# Patient Record
Sex: Female | Born: 1970 | Race: White | Hispanic: No | Smoking: Current every day smoker
Health system: Southern US, Community
[De-identification: ages and names within clinical notes are randomized; demographics above are authoritative.]

## PROBLEM LIST (undated history)

## (undated) DIAGNOSIS — K900 Celiac disease: Secondary | ICD-10-CM

## (undated) DIAGNOSIS — M1990 Unspecified osteoarthritis, unspecified site: Secondary | ICD-10-CM

## (undated) DIAGNOSIS — M5030 Other cervical disc degeneration, unspecified cervical region: Secondary | ICD-10-CM

## (undated) DIAGNOSIS — T148XXA Other injury of unspecified body region, initial encounter: Secondary | ICD-10-CM

## (undated) DIAGNOSIS — M549 Dorsalgia, unspecified: Secondary | ICD-10-CM

## (undated) DIAGNOSIS — R000 Tachycardia, unspecified: Secondary | ICD-10-CM

## (undated) DIAGNOSIS — M5430 Sciatica, unspecified side: Secondary | ICD-10-CM

---

## 2006-12-07 ENCOUNTER — Emergency Department (HOSPITAL_COMMUNITY): Admission: EM | Admit: 2006-12-07 | Discharge: 2006-12-07 | Payer: Self-pay | Admitting: Emergency Medicine

## 2006-12-29 ENCOUNTER — Emergency Department (HOSPITAL_COMMUNITY): Admission: EM | Admit: 2006-12-29 | Discharge: 2006-12-29 | Payer: Self-pay | Admitting: Emergency Medicine

## 2007-01-11 ENCOUNTER — Emergency Department (HOSPITAL_COMMUNITY): Admission: EM | Admit: 2007-01-11 | Discharge: 2007-01-11 | Payer: Self-pay | Admitting: Emergency Medicine

## 2007-03-07 ENCOUNTER — Emergency Department (HOSPITAL_COMMUNITY): Admission: EM | Admit: 2007-03-07 | Discharge: 2007-03-07 | Payer: Self-pay | Admitting: *Deleted

## 2007-08-01 ENCOUNTER — Emergency Department (HOSPITAL_COMMUNITY): Admission: EM | Admit: 2007-08-01 | Discharge: 2007-08-01 | Payer: Self-pay | Admitting: Emergency Medicine

## 2007-09-18 ENCOUNTER — Emergency Department (HOSPITAL_COMMUNITY): Admission: EM | Admit: 2007-09-18 | Discharge: 2007-09-18 | Payer: Self-pay | Admitting: Emergency Medicine

## 2007-11-24 ENCOUNTER — Emergency Department (HOSPITAL_COMMUNITY): Admission: EM | Admit: 2007-11-24 | Discharge: 2007-11-24 | Payer: Self-pay | Admitting: Emergency Medicine

## 2008-03-04 ENCOUNTER — Emergency Department (HOSPITAL_COMMUNITY): Admission: EM | Admit: 2008-03-04 | Discharge: 2008-03-04 | Payer: Self-pay | Admitting: Emergency Medicine

## 2008-06-19 ENCOUNTER — Emergency Department (HOSPITAL_COMMUNITY): Admission: EM | Admit: 2008-06-19 | Discharge: 2008-06-19 | Payer: Self-pay | Admitting: Emergency Medicine

## 2008-12-03 ENCOUNTER — Emergency Department (HOSPITAL_COMMUNITY): Admission: EM | Admit: 2008-12-03 | Discharge: 2008-12-03 | Payer: Self-pay | Admitting: Emergency Medicine

## 2010-05-20 ENCOUNTER — Emergency Department (HOSPITAL_COMMUNITY): Payer: Medicaid Other

## 2010-05-20 ENCOUNTER — Emergency Department (HOSPITAL_COMMUNITY)
Admission: EM | Admit: 2010-05-20 | Discharge: 2010-05-20 | Disposition: A | Discharge status: Home / Self Care | Payer: Medicaid Other | Attending: Emergency Medicine | Admitting: Emergency Medicine

## 2010-05-20 DIAGNOSIS — M25519 Pain in unspecified shoulder: Secondary | ICD-10-CM | POA: Insufficient documentation

## 2010-07-13 ENCOUNTER — Emergency Department (HOSPITAL_COMMUNITY)
Admission: EM | Admit: 2010-07-13 | Discharge: 2010-07-13 | Disposition: A | Discharge status: Home / Self Care | Payer: Self-pay | Attending: Emergency Medicine | Admitting: Emergency Medicine

## 2010-07-13 DIAGNOSIS — M545 Low back pain, unspecified: Secondary | ICD-10-CM | POA: Insufficient documentation

## 2010-07-13 DIAGNOSIS — F17200 Nicotine dependence, unspecified, uncomplicated: Secondary | ICD-10-CM | POA: Insufficient documentation

## 2010-08-22 NOTE — Consult Note (Signed)
NAME:  [name], [name] NO.:  [id number]   MEDICAL RECORD NO.:  [id number]          PATIENT TYPE:  EMS   LOCATION:  ED                            FACILITY:  [facility]   PHYSICIAN:  Barbaraann Barthel, M.D. DATE OF BIRTH:  1970-04-26   DATE OF CONSULTATION:  DATE OF DISCHARGE:  06/19/2008                                 CONSULTATION   Dr Dalia Heading:   I saw Mr. Lisa Mcclain in my office on September 11, 2008 at which time I evaluated  a large abdominal wall hernia.  As you know, this is the result of  multiple surgeries which began in 2007 after being treated for ruptured  diverticulitis with a Hartmann procedure.  In essence, he has had a  failed repair of the resulting abdominal wall hernia treated with mesh  in the past and now presents with essentially a complete breakdown of  the same.   I reviewed his medical records and after his examination in considering  the risks and benefit on this patient, I would certainly NOT consider  operating on him.  He seems to be getting along fairly well with an  abdominal binder, and I think that is the safest approach.  I discussed  this candidly with him and his son and I referred him back to Dr.  Cleotis Nipper, whom he identifies as  his primary surgeon and I wanted to  let you know (since you did his last repair) what my interaction was  with this patient.   Sincerely,  Wm.S.Bradford MD      Barbaraann Barthel, M.D.  Electronically Signed     WB/MEDQ  D:  09/11/2008  T:  09/12/2008  Job:  [id number]   cc:   [name], M.D.  Fax: [phone]

## 2010-08-30 ENCOUNTER — Emergency Department (HOSPITAL_COMMUNITY)
Admission: EM | Admit: 2010-08-30 | Discharge: 2010-08-30 | Disposition: A | Discharge status: Home / Self Care | Payer: Medicaid Other | Attending: Emergency Medicine | Admitting: Emergency Medicine

## 2010-08-30 DIAGNOSIS — M25519 Pain in unspecified shoulder: Secondary | ICD-10-CM | POA: Insufficient documentation

## 2010-08-30 DIAGNOSIS — M545 Low back pain, unspecified: Secondary | ICD-10-CM | POA: Insufficient documentation

## 2010-08-30 DIAGNOSIS — M5430 Sciatica, unspecified side: Secondary | ICD-10-CM | POA: Insufficient documentation

## 2010-08-30 DIAGNOSIS — K900 Celiac disease: Secondary | ICD-10-CM | POA: Insufficient documentation

## 2010-10-11 ENCOUNTER — Emergency Department (HOSPITAL_COMMUNITY)
Admission: EM | Admit: 2010-10-11 | Discharge: 2010-10-12 | Disposition: A | Discharge status: Home / Self Care | Payer: Medicaid Other | Attending: Emergency Medicine | Admitting: Emergency Medicine

## 2010-10-11 ENCOUNTER — Encounter: Payer: Self-pay | Admitting: *Deleted

## 2010-10-11 DIAGNOSIS — M5430 Sciatica, unspecified side: Secondary | ICD-10-CM

## 2010-10-11 DIAGNOSIS — F17200 Nicotine dependence, unspecified, uncomplicated: Secondary | ICD-10-CM | POA: Insufficient documentation

## 2010-10-11 HISTORY — DX: Tachycardia, unspecified: R00.0

## 2010-10-11 HISTORY — DX: Celiac disease: K90.0

## 2010-10-11 HISTORY — DX: Dorsalgia, unspecified: M54.9

## 2010-10-11 MED ORDER — DEXAMETHASONE SODIUM PHOSPHATE 4 MG/ML IJ SOLN
8.0000 mg | Freq: Once | INTRAMUSCULAR | Status: AC
  Administered 2010-10-11: 8 mg via INTRAMUSCULAR
  Filled 2010-10-11: qty 2

## 2010-10-11 MED ORDER — CYCLOBENZAPRINE HCL 10 MG PO TABS
10.0000 mg | ORAL_TABLET | Freq: Once | ORAL | Status: AC
  Administered 2010-10-11: 10 mg via ORAL
  Filled 2010-10-11: qty 1

## 2010-10-11 MED ORDER — HYDROCODONE-ACETAMINOPHEN 5-325 MG PO TABS
2.0000 | ORAL_TABLET | Freq: Once | ORAL | Status: AC
  Administered 2010-10-11: 2 via ORAL
  Filled 2010-10-11: qty 2

## 2010-10-11 NOTE — ED Notes (Signed)
Pt was unloading 50lb bags of horse feed and felt her lower back catch.

## 2010-10-11 NOTE — ED Notes (Signed)
Family at bedside. 

## 2010-10-11 NOTE — ED Notes (Signed)
Patient is resting comfortably. 

## 2010-10-11 NOTE — ED Notes (Signed)
Family at bedside. Pt asking how much longer before she is discharged and states she is ready to go: I informed pt that the EDP would discharge her as soon as he could; pt is getting dressed at this time

## 2010-10-12 ENCOUNTER — Encounter (HOSPITAL_COMMUNITY): Payer: Self-pay

## 2010-10-12 MED ORDER — CYCLOBENZAPRINE HCL 10 MG PO TABS: 10.0000 mg | ORAL_TABLET | Freq: Two times a day (BID) | ORAL | Status: AC | PRN

## 2010-10-12 MED ORDER — HYDROCODONE-ACETAMINOPHEN 5-500 MG PO TABS: 1.0000 | ORAL_TABLET | Freq: Four times a day (QID) | ORAL | Status: AC | PRN

## 2010-10-12 NOTE — ED Provider Notes (Addendum)
History     Chief Complaint  Patient presents with  . Back Pain    Pt c/o lower back pain radiating down right leg.   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  She has had radiation of numbness to her ankle prior to today.  Patient is a 40 y.o. female presenting with back pain. The history is provided by the patient.  Back Pain  This is a chronic problem. The current episode started 6 to 12 hours ago. The problem occurs constantly. The problem has not changed since onset.The pain is associated with lifting heavy objects. Pain location: Para lumbar pain. The quality of the pain is described as burning and shooting. The pain radiates to the right thigh. The pain is at a severity of 8/10. The symptoms are aggravated by bending, twisting and certain positions. Associated symptoms include numbness. Pertinent negatives include no bowel incontinence, no perianal numbness, no bladder incontinence, no dysuria, no pelvic pain, no paresthesias and no weakness. She has tried bed rest for the symptoms. The treatment provided no relief.    Past Medical History  Diagnosis Date  . Celiac disease   . Back pain   . Tachycardia     History reviewed. No pertinent past surgical history.  Family History  Problem Relation Age of Onset  . Cancer Other     History  Substance Use Topics  . Smoking status: Current Everyday Smoker  . Smokeless tobacco: Not on file  . Alcohol Use: No    OB History    Grav Para Term Preterm Abortions TAB SAB Ect Mult Living   3 2   1            Review of Systems  Gastrointestinal: Negative for bowel incontinence.  Genitourinary: Negative for bladder incontinence, dysuria, hematuria, difficulty urinating and pelvic pain.  Musculoskeletal: Positive for back pain.  Neurological: Positive for numbness. Negative for weakness and paresthesias.  All other systems reviewed and are negative.    Physical Exam  BP 131/83   Temp(Src) 98.5 F (36.9 C) (Oral)  Resp 20  Ht 5\' 5"  (1.651 m)  Wt 150 lb (68.04 kg)  BMI 24.96 kg/m2  SpO2 100%  LMP 10/11/2010  Physical Exam  Constitutional: She is oriented to person, place, and time. She appears well-developed and well-nourished. She appears distressed.  HENT:  Head: Normocephalic and atraumatic.  Eyes: Pupils are equal, round, and reactive to light.  Neck: Normal range of motion.  Pulmonary/Chest: Effort normal and breath sounds normal.  Abdominal: Soft.  Musculoskeletal: Normal range of motion.       Lumbar back: She exhibits tenderness and pain. She exhibits normal range of motion, no swelling and no deformity.  Neurological: She is alert and oriented to person, place, and time.  Skin: Skin is warm and dry.  Psychiatric: She has a normal mood and affect.    ED Course  Procedures  MDM   Low back pain c rad to bilat legs R>L;  Chronic;  No bowel/bladder incontinence    Candis Musa, PA 10/12/10 0038  Donnetta Hutching, MD 10/12/10 [phone]

## 2010-10-30 ENCOUNTER — Emergency Department (HOSPITAL_COMMUNITY)
Admission: EM | Admit: 2010-10-30 | Discharge: 2010-10-30 | Disposition: A | Discharge status: Home / Self Care | Payer: Medicaid Other | Attending: Emergency Medicine | Admitting: Emergency Medicine

## 2010-10-30 ENCOUNTER — Encounter (HOSPITAL_COMMUNITY): Payer: Self-pay | Admitting: *Deleted

## 2010-10-30 DIAGNOSIS — F17200 Nicotine dependence, unspecified, uncomplicated: Secondary | ICD-10-CM | POA: Insufficient documentation

## 2010-10-30 DIAGNOSIS — M5430 Sciatica, unspecified side: Secondary | ICD-10-CM | POA: Insufficient documentation

## 2010-10-30 MED ORDER — CYCLOBENZAPRINE HCL 10 MG PO TABS: 10.0000 mg | ORAL_TABLET | Freq: Three times a day (TID) | ORAL | Status: AC | PRN

## 2010-10-30 MED ORDER — OXYCODONE-ACETAMINOPHEN 5-325 MG PO TABS
1.0000 | ORAL_TABLET | Freq: Once | ORAL | Status: AC
  Administered 2010-10-30: 1 via ORAL
  Filled 2010-10-30: qty 1

## 2010-10-30 MED ORDER — CYCLOBENZAPRINE HCL 10 MG PO TABS
10.0000 mg | ORAL_TABLET | Freq: Once | ORAL | Status: AC
  Administered 2010-10-30: 10 mg via ORAL
  Filled 2010-10-30: qty 1

## 2010-10-30 MED ORDER — OXYCODONE-ACETAMINOPHEN 5-325 MG PO TABS: 1.0000 | ORAL_TABLET | ORAL | Status: AC | PRN

## 2010-10-30 NOTE — ED Provider Notes (Signed)
History     Chief Complaint  Patient presents with  . Back Pain    lower back pain radiating down LLE   HPI Comments: Patient c/o increased low back pain since yesterday.  States she bent over to pick up something and felt a sharp pain in her left  Lower back and radiating into her leg.  She denies numbness, weakness, incontinence of feces or urine.  Also denies fall  Patient is a 40 y.o. female presenting with back pain. The history is provided by the patient.  Back Pain  This is a recurrent problem. The current episode started more than 2 days ago. The problem occurs constantly. The problem has been gradually worsening. Associated with: bent over to pick up something. The pain is present in the lumbar spine. The quality of the pain is described as shooting, burning and stabbing. The pain radiates to the left thigh. The pain is at a severity of 10/10. The pain is moderate. The symptoms are aggravated by bending and certain positions. The pain is the same all the time. Pertinent negatives include no chest pain, no fever, no numbness, no headaches, no abdominal pain, no abdominal swelling, no bowel incontinence, no bladder incontinence, no dysuria, no pelvic pain, no paresthesias, no paresis, no tingling and no weakness. She has tried nothing for the symptoms.    Past Medical History  Diagnosis Date  . Celiac disease   . Back pain   . Tachycardia     History reviewed. No pertinent past surgical history.  Family History  Problem Relation Age of Onset  . Cancer Other     History  Substance Use Topics  . Smoking status: Current Everyday Smoker  . Smokeless tobacco: Not on file  . Alcohol Use: No    OB History    Grav Para Term Preterm Abortions TAB SAB Ect Mult Living   3 2   1            Review of Systems  Constitutional: Negative for fever, chills, appetite change and fatigue.  HENT: Negative for neck pain and neck stiffness.   Respiratory: Negative for cough, chest  tightness and wheezing.   Cardiovascular: Negative for chest pain.  Gastrointestinal: Negative for abdominal pain and bowel incontinence.  Genitourinary: Negative for bladder incontinence, dysuria, frequency, flank pain, vaginal bleeding, vaginal discharge and pelvic pain.  Musculoskeletal: Positive for back pain. Negative for myalgias and gait problem.  Skin: Negative.   Neurological: Negative for dizziness, tingling, weakness, numbness, headaches and paresthesias.  Hematological: Does not bruise/bleed easily.    Physical Exam  BP 117/54  Pulse 74  Temp(Src) 98.6 F (37 C) (Oral)  Resp 20  Ht 5\' 5"  (1.651 m)  Wt 160 lb (72.576 kg)  BMI 26.63 kg/m2  SpO2 99%  LMP 10/11/2010  Physical Exam  Nursing note and vitals reviewed. Constitutional: She appears well-developed and well-nourished. No distress.  HENT:  Head: Normocephalic and atraumatic.  Eyes: EOM are normal.  Neck: Normal range of motion.  Cardiovascular: Normal rate, regular rhythm and normal heart sounds.   Pulmonary/Chest: Effort normal and breath sounds normal. No respiratory distress. She exhibits no tenderness.  Abdominal: Soft. There is no tenderness. There is no rebound and no guarding.  Musculoskeletal: She exhibits tenderness. She exhibits no edema.       Lumbar back: She exhibits tenderness.       Back:  Lymphadenopathy:    She has no cervical adenopathy.  Neurological: She is alert. She has  normal reflexes. She exhibits normal muscle tone. Coordination normal.  Skin: Skin is warm.  Psychiatric: She has a normal mood and affect.    ED Course  Procedures  MDM   Patient ambulated to the restroom w/o difficulty.  Vitals stable.  ttp to left lumbar paraspinal muscles and left SI joint.  Likely sciatica     Karyn Brull L. Keisean Skowron, [place] 10/30/10 1351

## 2010-10-30 NOTE — ED Notes (Signed)
C/o onset lower back pain, radiating down LLE, yesterday after doing yardwork; hx chronic back pain

## 2010-11-22 ENCOUNTER — Encounter (HOSPITAL_COMMUNITY): Payer: Self-pay | Admitting: *Deleted

## 2010-11-22 ENCOUNTER — Emergency Department (HOSPITAL_COMMUNITY)
Admission: EM | Admit: 2010-11-22 | Discharge: 2010-11-22 | Disposition: A | Discharge status: Home / Self Care | Payer: Medicaid Other | Attending: Emergency Medicine | Admitting: Emergency Medicine

## 2010-11-22 DIAGNOSIS — M5412 Radiculopathy, cervical region: Secondary | ICD-10-CM | POA: Insufficient documentation

## 2010-11-22 DIAGNOSIS — F17200 Nicotine dependence, unspecified, uncomplicated: Secondary | ICD-10-CM | POA: Insufficient documentation

## 2010-11-22 DIAGNOSIS — M542 Cervicalgia: Secondary | ICD-10-CM

## 2010-11-22 MED ORDER — HYDROCODONE-ACETAMINOPHEN 5-325 MG PO TABS
1.0000 | ORAL_TABLET | Freq: Once | ORAL | Status: AC
  Administered 2010-11-22: 1 via ORAL
  Filled 2010-11-22: qty 1

## 2010-11-22 MED ORDER — CYCLOBENZAPRINE HCL 10 MG PO TABS: 5.0000 mg | ORAL_TABLET | Freq: Three times a day (TID) | ORAL | Status: DC | PRN

## 2010-11-22 MED ORDER — LORAZEPAM 1 MG PO TABS: 1.0000 mg | ORAL_TABLET | Freq: Three times a day (TID) | ORAL | Status: AC | PRN

## 2010-11-22 MED ORDER — HYDROCODONE-ACETAMINOPHEN 5-325 MG PO TABS: 2.0000 | ORAL_TABLET | ORAL | Status: DC | PRN

## 2010-11-22 MED ORDER — CYCLOBENZAPRINE HCL 10 MG PO TABS
5.0000 mg | ORAL_TABLET | Freq: Once | ORAL | Status: AC
  Administered 2010-11-22: 5 mg via ORAL
  Filled 2010-11-22 (×2): qty 1

## 2010-11-22 NOTE — ED Notes (Signed)
Patient with no complaints at this time. Respirations even and unlabored. Skin warm/dry. Discharge instructions reviewed with patient at this time. Patient given opportunity to voice concerns/ask questions. Patient discharged at this time and left Emergency Department with steady gait.   

## 2010-11-22 NOTE — ED Notes (Signed)
Pt c/o pain in her neck radiating down her left arm x 1 week. Pt states that it hurts to move her neck. Denies injury.

## 2010-11-22 NOTE — ED Provider Notes (Addendum)
History     CSN: [id number] Arrival date & time: 11/22/2010 12:25 PM  Chief Complaint  Patient presents with  . Neck Pain   Patient is a 40 y.o. female presenting with neck pain.  Neck Pain  Associated symptoms include numbness.   Complains of left-sided neck pain radiating to left arm, scapular area and fingers the left hand onset one week ago worse with moving her neck with some numbness and left scapular area no weakness treated herself with her husbands gabapentin and Flexeril with partial relief symptoms improved with remaining still. She is also using a sling periodically . No fever no injury Past Medical History  Diagnosis Date  . Celiac disease   . Back pain   . Tachycardia     History reviewed. No pertinent past surgical history.  Family History  Problem Relation Age of Onset  . Cancer Other     History  Substance Use Topics  . Smoking status: Current Everyday Smoker  . Smokeless tobacco: Not on file  . Alcohol Use: No    OB History    Grav Para Term Preterm Abortions TAB SAB Ect Mult Living   3 2   1            Review of Systems  Constitutional: Negative.   HENT: Positive for neck pain.   Respiratory: Negative.   Cardiovascular: Negative.   Gastrointestinal: Negative.   Skin: Negative.   Neurological: Positive for numbness.  Hematological: Negative.   Psychiatric/Behavioral: Negative.     Physical Exam  BP 145/92  Pulse 112  Temp(Src) 98.4 F (36.9 C) (Oral)  Resp 18  Ht 5\' 5"  (1.651 m)  Wt 160 lb (72.576 kg)  BMI 26.63 kg/m2  SpO2 100%  LMP 11/09/2010  Physical Exam  Nursing note and vitals reviewed. Constitutional: She appears well-developed and well-nourished.  HENT:  Head: Normocephalic and atraumatic.  Eyes: Conjunctivae are normal. Pupils are equal, round, and reactive to light.  Neck: Neck supple. No tracheal deviation present. No thyromegaly present.       Or point tenderness no spasm  Cardiovascular: Normal rate and regular  rhythm.   No murmur heard. Pulmonary/Chest: Effort normal and breath sounds normal.  Abdominal: Soft. Bowel sounds are normal. She exhibits no distension. There is no tenderness.  Musculoskeletal: Normal range of motion. She exhibits no edema and no tenderness.  Neurological: She is alert. She has normal reflexes. Coordination normal.       Is 5 over 5 overall  Skin: Skin is warm and dry. No rash noted.  Psychiatric: She has a normal mood and affect.    ED Course  Procedures  MDM Plan a cervical spine was ordered for 11/24/2010 as outpatient Prescription Flexeril, hydrocodone -apap    Prescription for Ativan one tablet no refills 1 hour prior MRI this patient exhibits claustrophobia  Doug Sou, MD 11/22/10 1414  Doug Sou, MD 11/22/10 1418

## 2010-11-24 ENCOUNTER — Encounter (HOSPITAL_COMMUNITY): Payer: Self-pay | Admitting: *Deleted

## 2010-11-24 ENCOUNTER — Emergency Department (HOSPITAL_COMMUNITY)
Admission: EM | Admit: 2010-11-24 | Discharge: 2010-11-24 | Disposition: A | Discharge status: Home / Self Care | Payer: Medicaid Other | Attending: Emergency Medicine | Admitting: Emergency Medicine

## 2010-11-24 DIAGNOSIS — F17200 Nicotine dependence, unspecified, uncomplicated: Secondary | ICD-10-CM | POA: Insufficient documentation

## 2010-11-24 DIAGNOSIS — M5412 Radiculopathy, cervical region: Secondary | ICD-10-CM | POA: Insufficient documentation

## 2010-11-24 DIAGNOSIS — M542 Cervicalgia: Secondary | ICD-10-CM

## 2010-11-24 MED ORDER — DIAZEPAM 5 MG PO TABS: 5.0000 mg | ORAL_TABLET | Freq: Three times a day (TID) | ORAL | Status: DC | PRN

## 2010-11-24 MED ORDER — OXYCODONE-ACETAMINOPHEN 5-325 MG PO TABS
1.0000 | ORAL_TABLET | Freq: Once | ORAL | Status: AC
  Administered 2010-11-24: 1 via ORAL
  Filled 2010-11-24: qty 1

## 2010-11-24 MED ORDER — OXYCODONE-ACETAMINOPHEN 5-325 MG PO TABS: 1.0000 | ORAL_TABLET | ORAL | Status: DC | PRN

## 2010-11-24 MED ORDER — DIAZEPAM 5 MG PO TABS
5.0000 mg | ORAL_TABLET | Freq: Once | ORAL | Status: AC
  Administered 2010-11-24: 5 mg via ORAL
  Filled 2010-11-24: qty 1

## 2010-11-24 MED ORDER — DEXAMETHASONE SODIUM PHOSPHATE 10 MG/ML IJ SOLN
10.0000 mg | Freq: Once | INTRAMUSCULAR | Status: AC
  Administered 2010-11-24: 10 mg via INTRAMUSCULAR
  Filled 2010-11-24: qty 1

## 2010-11-24 NOTE — ED Notes (Signed)
Seen x 2 days ago for same - Has appt for MRI on Wednesday of this week.  States pain is worse today upon awakening.  States pushed buggy of groceries yesterday.  States neck pain is radiating into left arm.

## 2010-11-24 NOTE — ED Notes (Signed)
Pt c/o neck pain radiating into shoulder. Seen last week and told to return if symptoms persisted. Pt reports she is out of pain medicine and Flexoril "is not working." MRI scheduled for Wed.

## 2010-11-25 NOTE — ED Provider Notes (Addendum)
History     CSN: [id number] Arrival date & time: 11/24/2010  2:30 PM  Chief Complaint  Patient presents with  . Neck Pain   Patient is a 40 y.o. female presenting with neck pain. The history is provided by the patient.  Neck Pain  This is a new (She describes pain in her left neck radiating down her left arm into her fingers.) problem. The current episode started more than 1 week ago. The problem occurs constantly. The problem has been gradually worsening. The pain is associated with nothing (She was seen for the same symptoms here 2 days ago and is scheduled for a c spine mri in 2 days.  Pushed a cart at the grocery store yesterday now with increased pain.). The pain is present in the left side. The quality of the pain is described as stabbing and shooting. The pain radiates to the left scapula, left shoulder, left arm, left forearm and left hand. The pain is at a severity of 8/10. The pain is moderate. The symptoms are aggravated by position. The pain is the same all the time. Associated symptoms include numbness. Pertinent negatives include no chest pain, no paresis and no weakness. Treatments tried: narcotics and flexeril prescribed 2 days ago. The treatment provided mild relief.    Past Medical History  Diagnosis Date  . Celiac disease   . Back pain   . Tachycardia     History reviewed. No pertinent past surgical history.  Family History  Problem Relation Age of Onset  . Cancer Other     History  Substance Use Topics  . Smoking status: Current Everyday Smoker  . Smokeless tobacco: Not on file  . Alcohol Use: No    OB History    Grav Para Term Preterm Abortions TAB SAB Ect Mult Living   3 2   1            Review of Systems  Constitutional: Negative.   HENT: Positive for neck pain. Negative for neck stiffness.   Eyes: Negative.   Cardiovascular: Negative.  Negative for chest pain.  Gastrointestinal: Negative.   Genitourinary: Negative.   Musculoskeletal: Negative for  myalgias and joint swelling.  Skin: Negative.   Neurological: Positive for numbness. Negative for weakness.  Psychiatric/Behavioral: The patient is nervous/anxious.     Physical Exam  BP 160/113  Pulse 117  Temp(Src) 98.4 F (36.9 C) (Oral)  Resp 20  Ht 5\' 5"  (1.651 m)  Wt 160 lb (72.576 kg)  BMI 26.63 kg/m2  SpO2 100%  LMP 11/09/2010  Physical Exam  Nursing note and vitals reviewed. Constitutional: She is oriented to person, place, and time. She appears well-developed and well-nourished.       Patient appears very anxious,  Distressed with frequent furrowing of brows and wincing - this appears to resolve when she is describing her history of present illness.  HENT:  Head: Normocephalic and atraumatic.  Eyes: Conjunctivae are normal.  Neck: Trachea normal. Neck supple. Muscular tenderness present. No spinous process tenderness present. No rigidity. No edema and no erythema present. No Brudzinski's sign noted. No thyromegaly present.       Decreased active ROM with left neck rotation.  Cardiovascular: Normal rate, regular rhythm, normal heart sounds and intact distal pulses.   Pulmonary/Chest: Effort normal and breath sounds normal. She has no wheezes.  Abdominal: Soft. Bowel sounds are normal. There is no tenderness.  Musculoskeletal: Normal range of motion. She exhibits no edema and no tenderness.  Left hand: She exhibits normal range of motion, no tenderness, normal capillary refill and no swelling. normal sensation noted. Normal strength noted.  Neurological: She is alert and oriented to person, place, and time. She has normal strength. A sensory deficit is present.  Reflex Scores:      Bicep reflexes are 2+ on the right side and 2+ on the left side.      Brachioradialis reflexes are 2+ on the right side and 2+ on the left side.      Decreased sensation to fine touch left forearm and hands compared to right.  DTR's intact  Skin: Skin is warm and dry.  Psychiatric: She  has a normal mood and affect.    ED Course  Procedures  MDM Patient pending MRI - no emergent findings on exam today.  Increase pain medicine with oxycodone,  Switch from flexeril to valium given anxious presentation and claim that flexeril is not relieving sx.      Candis Musa, PA 11/25/10 1053   11/26/2010; 16:50. Discussed MRI results with patient in person.  Referred to Dr. Lovell Sheehan for orthopedic surgery consultation. She is nearly out of her pain medication so I ordered refill.  DISCHARGE MEDICATIONS: New Prescriptions   DIAZEPAM (VALIUM) 5 MG TABLET    Take 1 tablet (5 mg total) by mouth every 8 (eight) hours as needed (muscle spasm).   OXYCODONE-ACETAMINOPHEN (PERCOCET) 5-325 MG PER TABLET    Take 1 tablet by mouth every 4 (four) hours as needed for pain.   Scribed for and reviewed by: Kennon Rounds, MD Scribed by: Earl Gala, MD 11/26/10 [phone]

## 2010-11-26 ENCOUNTER — Ambulatory Visit (HOSPITAL_COMMUNITY)
Admit: 2010-11-26 | Discharge: 2010-11-26 | Disposition: A | Discharge status: Home / Self Care | Payer: Medicaid Other | Source: Ambulatory Visit | Attending: Emergency Medicine | Admitting: Emergency Medicine

## 2010-11-26 DIAGNOSIS — R209 Unspecified disturbances of skin sensation: Secondary | ICD-10-CM | POA: Insufficient documentation

## 2010-11-26 DIAGNOSIS — M542 Cervicalgia: Secondary | ICD-10-CM | POA: Insufficient documentation

## 2010-11-26 DIAGNOSIS — M5030 Other cervical disc degeneration, unspecified cervical region: Secondary | ICD-10-CM | POA: Insufficient documentation

## 2010-11-26 MED ORDER — OXYCODONE-ACETAMINOPHEN 5-325 MG PO TABS: 1.0000 | ORAL_TABLET | ORAL | Status: AC | PRN

## 2010-11-26 MED ORDER — DIAZEPAM 5 MG PO TABS: 5.0000 mg | ORAL_TABLET | Freq: Three times a day (TID) | ORAL | Status: AC | PRN

## 2010-11-26 NOTE — ED Provider Notes (Signed)
Medical screening examination/treatment/procedure(s) were performed by non-physician practitioner and as supervising physician I was immediately available for consultation/collaboration.   Forbes Cellar, MD 11/26/10 [phone]

## 2010-11-30 ENCOUNTER — Encounter (HOSPITAL_COMMUNITY): Payer: Self-pay

## 2010-11-30 ENCOUNTER — Emergency Department (HOSPITAL_COMMUNITY)
Admission: EM | Admit: 2010-11-30 | Discharge: 2010-11-30 | Disposition: A | Discharge status: Home / Self Care | Payer: Medicaid Other | Attending: Emergency Medicine | Admitting: Emergency Medicine

## 2010-11-30 DIAGNOSIS — Z760 Encounter for issue of repeat prescription: Secondary | ICD-10-CM | POA: Insufficient documentation

## 2010-11-30 DIAGNOSIS — M542 Cervicalgia: Secondary | ICD-10-CM

## 2010-11-30 DIAGNOSIS — F17200 Nicotine dependence, unspecified, uncomplicated: Secondary | ICD-10-CM | POA: Insufficient documentation

## 2010-11-30 MED ORDER — OXYCODONE-ACETAMINOPHEN 5-325 MG PO TABS
1.0000 | ORAL_TABLET | Freq: Once | ORAL | Status: AC
  Administered 2010-11-30: 1 via ORAL
  Filled 2010-11-30: qty 1

## 2010-11-30 MED ORDER — OXYCODONE-ACETAMINOPHEN 5-325 MG PO TABS: 1.0000 | ORAL_TABLET | ORAL | Status: DC | PRN

## 2010-11-30 MED ORDER — OXYCODONE-ACETAMINOPHEN 5-325 MG PO TABS: 1.0000 | ORAL_TABLET | Freq: Once | ORAL | Status: AC

## 2010-11-30 NOTE — ED Notes (Signed)
Pt reports being dx with a "pinched nerve" in her neck on Wednesday.  Pt reports having an appt with a neurosurgeon tmrw at 3:00pm.  Pt was given percocet and valium for pain.  Pt is now out of pain meds and doesn't think that she can make it until tmrw.  nad noted

## 2010-11-30 NOTE — ED Provider Notes (Signed)
Medical screening examination/treatment/procedure(s) were performed by non-physician practitioner and as supervising physician I was immediately available for consultation/collaboration.  Devoria Albe, MD, FACEP  Ward Givens, MD 11/30/10 [phone]

## 2010-11-30 NOTE — ED Provider Notes (Signed)
History     CSN: [id number] Arrival date & time: 11/30/2010 11:53 AM  Chief Complaint  Patient presents with  . Neck Pain   Patient is a 40 y.o. female presenting with neck pain. The history is provided by the patient.  Neck Pain  The current episode started more than 1 week ago. The problem occurs constantly. There has been no fever. The pain is present in the left side. The quality of the pain is described as stabbing. The pain radiates to the left arm. The symptoms are aggravated by position.   Lisa Mcclain is a 40 y.o. WF who presents to the ED for medication refill. She was evaluated on 8/22 and had MRI of her neck and results showed a pinched nerve. She is scheduled to see the Neurosurgeon tomorrow in [place]. She is currently taking valium and percocet. She ran out of the percocet and request enough to get her through tomorrow when she sees the doctor.      Past Medical History  Diagnosis Date  . Celiac disease   . Back pain   . Tachycardia     History reviewed. No pertinent past surgical history.  Family History  Problem Relation Age of Onset  . Cancer Other     History  Substance Use Topics  . Smoking status: Current Everyday Smoker  . Smokeless tobacco: Not on file  . Alcohol Use: No    OB History    Grav Para Term Preterm Abortions TAB SAB Ect Mult Living   3 2   1            Review of Systems  HENT: Positive for neck pain.   All other systems reviewed and are negative.    Physical Exam  BP 115/84  Pulse 128  Temp(Src) 98.6 F (37 C) (Oral)  Resp 22  Ht 5\' 4"  (1.626 m)  Wt 160 lb (72.576 kg)  BMI 27.46 kg/m2  SpO2 100%  LMP 11/09/2010  Physical Exam  Nursing note and vitals reviewed. Constitutional: She is oriented to person, place, and time. She appears well-developed and well-nourished.  HENT:  Head: Normocephalic and atraumatic.  Eyes: EOM are normal. Pupils are equal, round, and reactive to light.  Neck: Neck supple.       Pain  with ROM of neck with radiating pain to left arm.  Pulmonary/Chest: Effort normal. No respiratory distress.  Musculoskeletal: Normal range of motion. She exhibits no edema.  Neurological: She is alert and oriented to person, place, and time. No cranial nerve deficit.       Equal grips bilaterally.  Skin: Skin is warm and dry.  Psychiatric: She has a normal mood and affect.    ED Course  Procedures  MDM Assessment: Neck pain                        Medication refill  Plan: refill percocet           Continue valium          Follow up with neuro as planned.          Return here as needed.           [place], [place] 11/30/10 1252

## 2010-11-30 NOTE — ED Notes (Signed)
Pt a/ox4. Resp even and unlabored. NAD at this time. D/C instructions reviewed with pt. Pt verbalized understanding. Pt ambulated with steady gate to POV. Friend with pt to drive home.

## 2010-12-01 NOTE — ED Notes (Signed)
Medical screening examination/treatment/procedure(s) were performed by non-physician practitioner and as supervising physician I was immediately available for consultation/collaboration.  Donnetta Hutching, MD 12/01/10 [phone]

## 2010-12-01 NOTE — ED Provider Notes (Signed)
History     CSN: [id number] Arrival date & time: 10/30/2010 11:29 AM  Chief Complaint  Patient presents with  . Back Pain    lower back pain radiating down LLE   HPI  Past Medical History  Diagnosis Date  . Celiac disease   . Back pain   . Tachycardia     History reviewed. No pertinent past surgical history.  Family History  Problem Relation Age of Onset  . Cancer Other     History  Substance Use Topics  . Smoking status: Current Everyday Smoker  . Smokeless tobacco: Not on file  . Alcohol Use: No    OB History    Grav Para Term Preterm Abortions TAB SAB Ect Mult Living   3 2   1            Review of Systems  Physical Exam  BP 117/54  Pulse 74  Temp(Src) 98.6 F (37 C) (Oral)  Resp 20  Ht 5\' 5"  (1.651 m)  Wt 160 lb (72.576 kg)  BMI 26.63 kg/m2  SpO2 99%  LMP 10/11/2010  Physical Exam  ED Course  Procedures  MDM Medical screening examination/treatment/procedure(s) were performed by non-physician practitioner and as supervising physician I was immediately available for consultation/collaboration.      Donnetta Hutching, MD 12/01/10 [phone]

## 2011-01-25 ENCOUNTER — Emergency Department (HOSPITAL_COMMUNITY)
Admission: EM | Admit: 2011-01-25 | Discharge: 2011-01-25 | Disposition: A | Discharge status: Home / Self Care | Payer: Medicaid Other | Attending: Emergency Medicine | Admitting: Emergency Medicine

## 2011-01-25 ENCOUNTER — Encounter (HOSPITAL_COMMUNITY): Payer: Self-pay | Admitting: Emergency Medicine

## 2011-01-25 DIAGNOSIS — M545 Low back pain, unspecified: Secondary | ICD-10-CM | POA: Insufficient documentation

## 2011-01-25 DIAGNOSIS — F17200 Nicotine dependence, unspecified, uncomplicated: Secondary | ICD-10-CM | POA: Insufficient documentation

## 2011-01-25 DIAGNOSIS — IMO0002 Reserved for concepts with insufficient information to code with codable children: Secondary | ICD-10-CM | POA: Insufficient documentation

## 2011-01-25 DIAGNOSIS — M5416 Radiculopathy, lumbar region: Secondary | ICD-10-CM

## 2011-01-25 DIAGNOSIS — M5030 Other cervical disc degeneration, unspecified cervical region: Secondary | ICD-10-CM | POA: Insufficient documentation

## 2011-01-25 HISTORY — DX: Other injury of unspecified body region, initial encounter: T14.8XXA

## 2011-01-25 HISTORY — DX: Other cervical disc degeneration, unspecified cervical region: M50.30

## 2011-01-25 HISTORY — DX: Unspecified osteoarthritis, unspecified site: M19.90

## 2011-01-25 MED ORDER — HYDROMORPHONE HCL 2 MG/ML IJ SOLN: 2.0000 mg | Freq: Once | INTRAMUSCULAR | Status: DC

## 2011-01-25 MED ORDER — HYDROMORPHONE HCL 2 MG/ML IJ SOLN
2.0000 mg | Freq: Once | INTRAMUSCULAR | Status: AC
  Administered 2011-01-25: 2 mg via INTRAMUSCULAR
  Filled 2011-01-25: qty 1

## 2011-01-25 MED ORDER — CYCLOBENZAPRINE HCL 10 MG PO TABS: 10.0000 mg | ORAL_TABLET | Freq: Two times a day (BID) | ORAL | Status: AC | PRN

## 2011-01-25 MED ORDER — OXYCODONE-ACETAMINOPHEN 5-325 MG PO TABS: 2.0000 | ORAL_TABLET | ORAL | Status: AC | PRN

## 2011-01-25 NOTE — ED Notes (Signed)
Patient c/o lower back pain with spasms in left leg. Patient reports hurting back while trying to lift a box of books.

## 2011-01-25 NOTE — ED Provider Notes (Signed)
Scribed for Donnetta Hutching, MD, the patient was seen in room APFT22/APFT22 . This chart was scribed by Ellie Lunch.   CSN: [id number] Arrival date & time: 01/25/2011  2:34 PM   First MD Initiated Contact with Patient 01/25/11 1505      Chief Complaint  Patient presents with  . Back Pain    (Consider location/radiation/quality/duration/timing/severity/associated sxs/prior treatment) HPI Lisa Mcclain is a 40 y.o. female with a history of back pain who presents to the Emergency Department complaining of lower back pain with spasms in left leg. Pt hurt back as she was lifting a 30 lbs box of books last night. Pain radiates down to left leg to knee. No urinary or bowel incontinence. Pt reports this is typical of her back pain. Pt reports in the past she has been treated with percocet and flexeril with improvement.   Past Medical History  Diagnosis Date  . Celiac disease   . Back pain   . Tachycardia   . Nerve compression   . Arthritis   . Degeneration of cervical intervertebral disc     History reviewed. No pertinent past surgical history.  Family History  Problem Relation Age of Onset  . Cancer Other     History  Substance Use Topics  . Smoking status: Current Everyday Smoker -- 1.0 packs/day for 28 years    Types: Cigarettes  . Smokeless tobacco: Never Used  . Alcohol Use: No    OB History    Grav Para Term Preterm Abortions TAB SAB Ect Mult Living   3 2   1  1   2       Review of Systems 10 Systems reviewed and are negative for acute change except as noted in the HPI.  Allergies  Erythromycin; Gluten; Keflex; Tramadol; Wheat; and Penicillins  Home Medications  No current outpatient prescriptions on file.  BP 135/100  Pulse 112  Resp 16  Ht 5\' 4"  (1.626 m)  Wt 160 lb (72.576 kg)  BMI 27.46 kg/m2  SpO2 100%  LMP 01/07/2011  Physical Exam  Nursing note and vitals reviewed. Constitutional: She is oriented to person, place, and time. She appears  well-developed and well-nourished.  HENT:  Head: Normocephalic and atraumatic.  Eyes: Conjunctivae and EOM are normal.  Neck: Normal range of motion. Neck supple.  Cardiovascular: Normal rate and regular rhythm.   Pulmonary/Chest: Effort normal and breath sounds normal.  Abdominal: Soft. There is no tenderness.  Musculoskeletal: She exhibits tenderness.       TTP lumbar spine  Neurological: She is alert and oriented to person, place, and time.       Pain with left straight raise.   Skin: Skin is warm and dry.  Psychiatric: She has a normal mood and affect. Judgment normal.   Procedures (including critical care time)  OTHER DATA REVIEWED: Nursing notes, vital signs reviewed.  DIAGNOSTIC STUDIES: Oxygen Saturation is 100% on room air, normal by my interpretation.    ED COURSE  3:15 PM EDP discussed with PT plan to treat pain with dilaudid 2 mg injection. Discussed treatment will require rest, time, and medication. Pt can apply cold pack to reduce inflammation. Pt can alternate hot and cold a few days after incident. Plan to discharge with referral to neurosurgery and prescription of flexeril and percocet.   Medications  HYDROmorphone (DILAUDID) injection 2 mg      No diagnosis found.  MDM  Patient has lumbar pain with radicular component to left leg. No bowel or  bladder incontinence.  Will treat pain and prescribed muscle relaxer.  Referral to neurosurgery I personally performed the services described in this documentation, which was scribed in my presence. The recorded information has been reviewed and considered.        Donnetta Hutching, MD 01/25/11 [phone]

## 2011-01-25 NOTE — ED Notes (Signed)
Pt states she is ready to go; Dr. Adriana Simas notified

## 2011-02-16 ENCOUNTER — Emergency Department (HOSPITAL_COMMUNITY)
Admission: EM | Admit: 2011-02-16 | Discharge: 2011-02-16 | Disposition: A | Discharge status: Home / Self Care | Payer: Medicaid Other | Attending: Emergency Medicine | Admitting: Emergency Medicine

## 2011-02-16 ENCOUNTER — Encounter (HOSPITAL_COMMUNITY): Payer: Self-pay | Admitting: Emergency Medicine

## 2011-02-16 DIAGNOSIS — K900 Celiac disease: Secondary | ICD-10-CM | POA: Insufficient documentation

## 2011-02-16 DIAGNOSIS — S139XXA Sprain of joints and ligaments of unspecified parts of neck, initial encounter: Secondary | ICD-10-CM | POA: Insufficient documentation

## 2011-02-16 DIAGNOSIS — X58XXXA Exposure to other specified factors, initial encounter: Secondary | ICD-10-CM | POA: Insufficient documentation

## 2011-02-16 DIAGNOSIS — M5030 Other cervical disc degeneration, unspecified cervical region: Secondary | ICD-10-CM | POA: Insufficient documentation

## 2011-02-16 DIAGNOSIS — S161XXA Strain of muscle, fascia and tendon at neck level, initial encounter: Secondary | ICD-10-CM

## 2011-02-16 DIAGNOSIS — F17200 Nicotine dependence, unspecified, uncomplicated: Secondary | ICD-10-CM | POA: Insufficient documentation

## 2011-02-16 MED ORDER — CYCLOBENZAPRINE HCL 10 MG PO TABS
10.0000 mg | ORAL_TABLET | Freq: Once | ORAL | Status: AC
Start: 2011-02-16 — End: 2011-02-16
  Administered 2011-02-16: 10 mg via ORAL
  Filled 2011-02-16: qty 1

## 2011-02-16 MED ORDER — PREDNISONE 10 MG PO TABS: ORAL_TABLET | ORAL | Status: DC

## 2011-02-16 MED ORDER — HYDROCODONE-ACETAMINOPHEN 5-325 MG PO TABS: ORAL_TABLET | ORAL | Status: AC

## 2011-02-16 MED ORDER — CYCLOBENZAPRINE HCL 10 MG PO TABS: 10.0000 mg | ORAL_TABLET | Freq: Three times a day (TID) | ORAL | Status: AC | PRN

## 2011-02-16 MED ORDER — HYDROCODONE-ACETAMINOPHEN 5-325 MG PO TABS
2.0000 | ORAL_TABLET | Freq: Once | ORAL | Status: AC
  Administered 2011-02-16: 2 via ORAL
  Filled 2011-02-16: qty 2

## 2011-02-16 NOTE — ED Notes (Signed)
Pt c/o neck pain since Saturday. Pt was recently diagnosed with bone spurs.

## 2011-02-16 NOTE — ED Notes (Signed)
Upon PA assessing pt, pt states she was registered under her married name and her Medicaid was under her maiden name. Registration further investigated and pt has not legally changed her last name. Medicaid is not aware of new marital status per registration. Situation reported by registration to supervisor and will be followed up with report to [facility] office.

## 2011-02-16 NOTE — ED Notes (Addendum)
Pt a/ox4. Resp even and unlabored. NAD at this time. D/C instructions and Rx x 3 reviewed with pt. Pt verbalized understanding. Pt ambulated to POV with steady gate. Husband with pt to transport home.

## 2011-02-16 NOTE — ED Provider Notes (Signed)
History     CSN: [id number] Arrival date & time: 02/16/2011 11:19 AM   First MD Initiated Contact with Patient 02/16/11 1155      Chief Complaint  Patient presents with  . Neck Pain    (Consider location/radiation/quality/duration/timing/severity/associated sxs/prior treatment) HPI Comments: Patient c/o chronic left neck and shoulder pain.  States she was recently diagnosed with "bone spurs" to her neck.  Pain is worse with palpation and movement of her neck.  She denies SOB, chest pain, numbness, stiffness of her neck or headaches.  Patient is a 40 y.o. female presenting with neck injury. The history is provided by the patient.  Neck Injury This is a chronic problem. The current episode started in the past 7 days. The problem occurs constantly. The problem has been unchanged. Associated symptoms include arthralgias and neck pain. Pertinent negatives include no abdominal pain, chest pain, chills, congestion, coughing, diaphoresis, fatigue, fever, joint swelling, myalgias, nausea, numbness, rash, sore throat, vertigo, visual change, vomiting or weakness. The symptoms are aggravated by twisting (movement and palpation). She has tried nothing for the symptoms. The treatment provided no relief.    Past Medical History  Diagnosis Date  . Celiac disease   . Back pain   . Tachycardia   . Nerve compression   . Arthritis   . Degeneration of cervical intervertebral disc     History reviewed. No pertinent past surgical history.  Family History  Problem Relation Age of Onset  . Cancer Other     History  Substance Use Topics  . Smoking status: Current Everyday Smoker -- 1.0 packs/day for 28 years    Types: Cigarettes  . Smokeless tobacco: Never Used  . Alcohol Use: No    OB History    Grav Para Term Preterm Abortions TAB SAB Ect Mult Living   3 2   1  1   2       Review of Systems  Constitutional: Negative for fever, chills, diaphoresis and fatigue.  HENT: Positive for neck  pain. Negative for congestion, sore throat, facial swelling, trouble swallowing and neck stiffness.   Respiratory: Negative for cough, shortness of breath and wheezing.   Cardiovascular: Negative for chest pain and palpitations.  Gastrointestinal: Negative for nausea, vomiting, abdominal pain and blood in stool.  Genitourinary: Negative for dysuria, hematuria and flank pain.  Musculoskeletal: Positive for arthralgias. Negative for myalgias, back pain and joint swelling.  Skin: Negative.  Negative for rash.  Neurological: Negative for dizziness, vertigo, facial asymmetry, weakness, light-headedness and numbness.  Hematological: Negative for adenopathy. Does not bruise/bleed easily.  All other systems reviewed and are negative.    Allergies  Erythromycin; Gluten; Keflex; Tramadol; Wheat; and Penicillins  Home Medications  No current outpatient prescriptions on file.  BP 139/81  Pulse 112  Temp(Src) 99.1 F (37.3 C) (Oral)  Resp 20  Ht 5\' 5"  (1.651 m)  Wt 165 lb (74.844 kg)  BMI 27.46 kg/m2  SpO2 100%  LMP 02/02/2011  Physical Exam  Nursing note and vitals reviewed. Constitutional: She is oriented to person, place, and time. She appears well-developed and well-nourished. No distress.  HENT:  Head: Normocephalic and atraumatic.  Mouth/Throat: Oropharynx is clear and moist.  Neck: Trachea normal and normal range of motion. Neck supple. Spinous process tenderness and muscular tenderness present. No edema, no erythema and normal range of motion present. No Brudzinski's sign and no Kernig's sign noted. No mass and no thyromegaly present.  Cardiovascular: Normal rate, regular rhythm and normal heart sounds.  Pulmonary/Chest: Effort normal and breath sounds normal. No respiratory distress. She exhibits no tenderness.  Musculoskeletal: Normal range of motion. She exhibits no tenderness.  Lymphadenopathy:    She has no cervical adenopathy.  Neurological: She is alert and oriented to  person, place, and time. No cranial nerve deficit or sensory deficit. She exhibits normal muscle tone. Coordination and gait normal.  Reflex Scores:      Tricep reflexes are 2+ on the right side and 2+ on the left side.      Bicep reflexes are 2+ on the left side.      Brachioradialis reflexes are 2+ on the right side and 2+ on the left side. Skin: Skin is warm and dry.    ED Course  Procedures (including critical care time)       MDM    12:57 PM patient is alert, NAD.  ttp of the left cervical paraspinal muscles and along the border of the left scapula.  Has full ROM of bilateral UE's.  No focal weakness, no neuro deficits on exam.  Pt states she has been seen by [facility] for this,  but prefers to be seen by another physician.  I will give her referral for Dr. Mort Sawyers office.  She is non-toxic appearing, no meningeal signs.   Pt feels improved after observation and/or treatment in ED.   Patient / Family / Caregiver understand and agree with initial ED impression and plan with expectations set for ED visit.         Jaliel Deavers L. Trisha Mangle, [place] 02/17/11 2014

## 2011-02-19 NOTE — ED Provider Notes (Signed)
Medical screening examination/treatment/procedure(s) were performed by non-physician practitioner and as supervising physician I was immediately available for consultation/collaboration.  Duanna Runk S. Ariann Khaimov, MD 02/19/11 1037 

## 2011-06-19 ENCOUNTER — Encounter (HOSPITAL_COMMUNITY): Payer: Self-pay

## 2011-06-19 ENCOUNTER — Emergency Department (HOSPITAL_COMMUNITY)
Admission: EM | Admit: 2011-06-19 | Discharge: 2011-06-19 | Disposition: A | Discharge status: Home / Self Care | Payer: Medicaid Other | Attending: Emergency Medicine | Admitting: Emergency Medicine

## 2011-06-19 ENCOUNTER — Emergency Department (HOSPITAL_COMMUNITY): Payer: Medicaid Other

## 2011-06-19 DIAGNOSIS — M5030 Other cervical disc degeneration, unspecified cervical region: Secondary | ICD-10-CM | POA: Insufficient documentation

## 2011-06-19 DIAGNOSIS — F17200 Nicotine dependence, unspecified, uncomplicated: Secondary | ICD-10-CM | POA: Insufficient documentation

## 2011-06-19 DIAGNOSIS — M129 Arthropathy, unspecified: Secondary | ICD-10-CM | POA: Insufficient documentation

## 2011-06-19 DIAGNOSIS — M25476 Effusion, unspecified foot: Secondary | ICD-10-CM | POA: Insufficient documentation

## 2011-06-19 DIAGNOSIS — K900 Celiac disease: Secondary | ICD-10-CM | POA: Insufficient documentation

## 2011-06-19 DIAGNOSIS — S40019A Contusion of unspecified shoulder, initial encounter: Secondary | ICD-10-CM | POA: Insufficient documentation

## 2011-06-19 DIAGNOSIS — M25519 Pain in unspecified shoulder: Secondary | ICD-10-CM | POA: Insufficient documentation

## 2011-06-19 DIAGNOSIS — X58XXXA Exposure to other specified factors, initial encounter: Secondary | ICD-10-CM | POA: Insufficient documentation

## 2011-06-19 DIAGNOSIS — M25473 Effusion, unspecified ankle: Secondary | ICD-10-CM | POA: Insufficient documentation

## 2011-06-19 MED ORDER — IBUPROFEN 600 MG PO TABS: 600.0000 mg | ORAL_TABLET | Freq: Four times a day (QID) | ORAL | Status: AC | PRN

## 2011-06-19 MED ORDER — OXYCODONE-ACETAMINOPHEN 5-325 MG PO TABS
1.0000 | ORAL_TABLET | Freq: Once | ORAL | Status: AC
  Administered 2011-06-19: 1 via ORAL
  Filled 2011-06-19: qty 1

## 2011-06-19 MED ORDER — OXYCODONE-ACETAMINOPHEN 5-325 MG PO TABS: 1.0000 | ORAL_TABLET | ORAL | Status: AC | PRN

## 2011-06-19 NOTE — ED Notes (Signed)
Left shoulder pain, h/o of same, pain/swelling since last night

## 2011-06-19 NOTE — Discharge Instructions (Signed)
As discussed her x-rays are negative today for any acute injury.  Please use a shoulder sling for comfort.  Ice applied to your shoulder intermittently over the next 24 hours may also be helpful.  Use caution with the oxycodone as this medication will cause drowsiness, do not drive within 4 hours of taking.  Call Dr. Romeo Apple for further management of your shoulder pain.

## 2011-06-21 NOTE — ED Provider Notes (Signed)
History     CSN: [id number]  Arrival date & time 06/19/11  1344   First MD Initiated Contact with Patient 06/19/11 1416      Chief Complaint  Patient presents with  . Shoulder Pain    (Consider location/radiation/quality/duration/timing/severity/associated sxs/prior treatment) Patient is a 41 y.o. female presenting with shoulder pain. The history is provided by the patient.  Shoulder Pain This is a recurrent problem. The current episode started yesterday. The problem occurs constantly. The problem has been unchanged. Associated symptoms include joint swelling. Pertinent negatives include no abdominal pain, arthralgias, chest pain, chills, congestion, fever, headaches, myalgias, nausea, neck pain, numbness, rash, sore throat or weakness. Exacerbated by: Movement and palpation of left shoulder worsens pain. She has tried nothing for the symptoms.    Past Medical History  Diagnosis Date  . Celiac disease   . Back pain   . Tachycardia   . Nerve compression   . Arthritis   . Degeneration of cervical intervertebral disc     History reviewed. No pertinent past surgical history.  Family History  Problem Relation Age of Onset  . Cancer Other     History  Substance Use Topics  . Smoking status: Current Everyday Smoker -- 1.0 packs/day for 28 years    Types: Cigarettes  . Smokeless tobacco: Never Used  . Alcohol Use: No    OB History    Grav Para Term Preterm Abortions TAB SAB Ect Mult Living   3 2   1  1   2       Review of Systems  Constitutional: Negative for fever and chills.  HENT: Negative for congestion, sore throat and neck pain.   Eyes: Negative.   Respiratory: Negative for chest tightness and shortness of breath.   Cardiovascular: Negative for chest pain.  Gastrointestinal: Negative for nausea and abdominal pain.  Genitourinary: Negative.   Musculoskeletal: Positive for joint swelling. Negative for myalgias and arthralgias.  Skin: Negative.  Negative for rash  and wound.  Neurological: Negative for dizziness, weakness, light-headedness, numbness and headaches.  Hematological: Negative.   Psychiatric/Behavioral: Negative.     Allergies  Erythromycin; Gluten; Keflex; Tramadol; Wheat; and Penicillins  Home Medications   Current Outpatient Rx  Name Route Sig Dispense Refill  . IBUPROFEN 600 MG PO TABS Oral Take 1 tablet (600 mg total) by mouth every 6 (six) hours as needed for pain. 15 tablet 0  . OXYCODONE-ACETAMINOPHEN 5-325 MG PO TABS Oral Take 1 tablet by mouth every 4 (four) hours as needed for pain. 15 tablet 0  . PREDNISONE 10 MG PO TABS  Take 6 tablets day one, 5 tablets day two, 4 tablets day three, 3 tablets day four, 2 tablets day five, then 1 tablet day six 21 tablet 0    BP 161/97  Pulse 116  Temp(Src) 98.4 F (36.9 C) (Oral)  Resp 20  Ht 5' 4.5" (1.638 m)  Wt 165 lb (74.844 kg)  BMI 27.88 kg/m2  SpO2 99%  LMP 06/07/2011  Physical Exam  Nursing note and vitals reviewed. Constitutional: She is oriented to person, place, and time. She appears well-developed and well-nourished.  HENT:  Head: Normocephalic and atraumatic.  Eyes: Conjunctivae are normal.  Neck: Normal range of motion.  Cardiovascular: Normal rate, regular rhythm, normal heart sounds and intact distal pulses.   Pulmonary/Chest: Effort normal and breath sounds normal. She has no wheezes.  Abdominal: She exhibits no distension.  Musculoskeletal:       Left shoulder: She exhibits decreased  range of motion, tenderness and swelling. She exhibits no crepitus, no deformity and normal pulse.       Small eccymosis and edema noted localized anterior left shoulder at humeral head.  No ttp at elbow or wrist.  FROM and full strength with resisted flexion of elbow.  Pain with attempted abduction of shoulder beyond 90 degrees.    Neurological: She is alert and oriented to person, place, and time.  Skin: Skin is warm and dry.  Psychiatric: She has a normal mood and affect.      ED Course  Procedures (including critical care time)   Dg Shoulder Left  06/19/2011  *RADIOLOGY REPORT*  Clinical Data: Left shoulder pain with redness and swelling.  No injury.  LEFT SHOULDER - 2+ VIEW  Comparison: 05/20/2010  Findings: No acute fracture or dislocation.  Joint spaces are maintained.  Visualized portions of the left hemithorax are within normal limits.  IMPRESSION: No acute osseous abnormality.  Original Report Authenticated By: Consuello Bossier, M.D.      1. Shoulder contusion       MDM  Pt prescribed oxycodone and ibuprofen, sling left shoulder supplied.  Referral to ortho for further mngt.  Note:  Call from local pharmacist to notify that this patient filled a prescription for hydrocodone, 10 day supply  2 days ago from an outside provider.  Pharmacist advised to not fill this oxycodone prescription.        Candis Musa, PA 06/21/11 2131

## 2011-06-22 NOTE — ED Provider Notes (Signed)
Medical screening examination/treatment/procedure(s) were performed by non-physician practitioner and as supervising physician I was immediately available for consultation/collaboration.  Cuma Polyakov, MD 06/22/11 1102 

## 2011-12-07 ENCOUNTER — Emergency Department (HOSPITAL_COMMUNITY)
Admission: EM | Admit: 2011-12-07 | Discharge: 2011-12-07 | Disposition: A | Discharge status: Home / Self Care | Payer: Self-pay | Attending: Emergency Medicine | Admitting: Emergency Medicine

## 2011-12-07 ENCOUNTER — Encounter (HOSPITAL_COMMUNITY): Payer: Self-pay | Admitting: *Deleted

## 2011-12-07 DIAGNOSIS — Z880 Allergy status to penicillin: Secondary | ICD-10-CM | POA: Insufficient documentation

## 2011-12-07 DIAGNOSIS — S43499A Other sprain of unspecified shoulder joint, initial encounter: Secondary | ICD-10-CM | POA: Insufficient documentation

## 2011-12-07 DIAGNOSIS — S46819A Strain of other muscles, fascia and tendons at shoulder and upper arm level, unspecified arm, initial encounter: Secondary | ICD-10-CM

## 2011-12-07 DIAGNOSIS — K900 Celiac disease: Secondary | ICD-10-CM | POA: Insufficient documentation

## 2011-12-07 DIAGNOSIS — F17200 Nicotine dependence, unspecified, uncomplicated: Secondary | ICD-10-CM | POA: Insufficient documentation

## 2011-12-07 DIAGNOSIS — X500XXA Overexertion from strenuous movement or load, initial encounter: Secondary | ICD-10-CM | POA: Insufficient documentation

## 2011-12-07 DIAGNOSIS — M5030 Other cervical disc degeneration, unspecified cervical region: Secondary | ICD-10-CM | POA: Insufficient documentation

## 2011-12-07 MED ORDER — HYDROCODONE-ACETAMINOPHEN 5-325 MG PO TABS: 1.0000 | ORAL_TABLET | ORAL | Status: AC | PRN

## 2011-12-07 MED ORDER — BACLOFEN 10 MG PO TABS: 10.0000 mg | ORAL_TABLET | Freq: Three times a day (TID) | ORAL | Status: AC

## 2011-12-07 MED ORDER — DEXAMETHASONE 4 MG PO TABS: ORAL_TABLET | ORAL | Status: AC

## 2011-12-07 NOTE — ED Provider Notes (Signed)
History     CSN: [id number]  Arrival date & time 12/07/11  0946   None     Chief Complaint  Patient presents with  . Neck Pain    (Consider location/radiation/quality/duration/timing/severity/associated sxs/prior treatment) Patient is a 41 y.o. female presenting with neck pain. The history is provided by the patient.  Neck Pain  This is a new problem. The current episode started 2 days ago. The problem occurs constantly. The problem has been gradually worsening. The pain is associated with lifting a heavy object. There has been no fever. The pain radiates to the left shoulder. The pain is severe. Exacerbated by: certain movement and positions. The pain is the same all the time. Associated symptoms include headaches. Pertinent negatives include no photophobia, no chest pain, no syncope and no bowel incontinence. She has tried nothing for the symptoms.    Past Medical History  Diagnosis Date  . Celiac disease   . Back pain   . Tachycardia   . Nerve compression   . Arthritis   . Degeneration of cervical intervertebral disc     History reviewed. No pertinent past surgical history.  Family History  Problem Relation Age of Onset  . Cancer Other     History  Substance Use Topics  . Smoking status: Current Everyday Smoker -- 1.0 packs/day for 28 years    Types: Cigarettes  . Smokeless tobacco: Never Used  . Alcohol Use: No    OB History    Grav Para Term Preterm Abortions TAB SAB Ect Mult Living   3 2   1  1   2       Review of Systems  Constitutional: Negative for activity change.       All ROS Neg except as noted in HPI  HENT: Positive for neck pain. Negative for nosebleeds.   Eyes: Negative for photophobia and discharge.  Respiratory: Negative for cough, shortness of breath and wheezing.   Cardiovascular: Negative for chest pain, palpitations and syncope.  Gastrointestinal: Negative for abdominal pain, blood in stool and bowel incontinence.  Genitourinary: Negative  for dysuria, frequency and hematuria.  Musculoskeletal: Positive for back pain and arthralgias.  Skin: Negative.   Neurological: Positive for headaches. Negative for dizziness, seizures and speech difficulty.  Psychiatric/Behavioral: Negative for hallucinations and confusion.    Allergies  Cephalexin; Erythromycin; Gluten; Tramadol; Wheat; and Penicillins  Home Medications   Current Outpatient Rx  Name Route Sig Dispense Refill  . PREDNISONE 10 MG PO TABS  Take 6 tablets day one, 5 tablets day two, 4 tablets day three, 3 tablets day four, 2 tablets day five, then 1 tablet day six 21 tablet 0    BP 137/90  Pulse 91  Temp 98.7 F (37.1 C)  Resp 18  Ht 5\' 4"  (1.626 m)  Wt 165 lb (74.844 kg)  BMI 28.32 kg/m2  SpO2 100%  LMP 12/07/2011  Physical Exam  Nursing note and vitals reviewed. Constitutional: She is oriented to person, place, and time. She appears well-developed and well-nourished.  Non-toxic appearance.  HENT:  Head: Normocephalic.  Right Ear: Tympanic membrane and external ear normal.  Left Ear: Tympanic membrane and external ear normal.  Eyes: EOM and lids are normal. Pupils are equal, round, and reactive to light.  Neck: Normal range of motion. Neck supple. Carotid bruit is not present.  Cardiovascular: Normal rate, regular rhythm, normal heart sounds, intact distal pulses and normal pulses.   Pulmonary/Chest: Breath sounds normal. No respiratory distress.  Abdominal: Soft.  Bowel sounds are normal. There is no tenderness. There is no guarding.  Musculoskeletal: Normal range of motion.       There is tightness and tenseness of the trapezius on the left. There is full range of motion of the left shoulder but with discomfort. There is no dislocation. There is full range of motion of the left elbow wrist and fingers. Capillary refill is less than 3 seconds on the left.  Lymphadenopathy:       Head (right side): No submandibular adenopathy present.       Head (left  side): No submandibular adenopathy present.    She has no cervical adenopathy.  Neurological: She is alert and oriented to person, place, and time. She has normal strength. No cranial nerve deficit or sensory deficit. She exhibits normal muscle tone. Coordination normal.       Grip and sensory is symmetrical.  Skin: Skin is warm and dry.  Psychiatric: She has a normal mood and affect. Her speech is normal.    ED Course  Procedures (including critical care time)  Labs Reviewed - No data to display No results found.   No diagnosis found.    MDM  I have reviewed nursing notes, vital signs, and all appropriate lab and imaging results for this patient. Examination is consistent with trapezius strain. Patient is advised to use ice pack, prescription for dexamethasone for 6 days, baclofen, and Norco given to the patient. Patient is to see the primary physician or orthopedics for evaluation if not improving.       Kathie Dike, [place] 12/07/11 1046

## 2011-12-07 NOTE — ED Provider Notes (Signed)
Medical screening examination/treatment/procedure(s) were performed by non-physician practitioner and as supervising physician I was immediately available for consultation/collaboration.   Daliyah Sramek, MD 12/07/11 1501 

## 2011-12-07 NOTE — ED Notes (Signed)
Pt c/o chronic neck pain due to ?pinched nerve and bone spur. Was moving and cleaning on Saturday when the pain became worse. Pt c/o increase neck pain and numbness to left arm.

## 2012-04-17 DIAGNOSIS — Z8669 Personal history of other diseases of the nervous system and sense organs: Secondary | ICD-10-CM | POA: Insufficient documentation

## 2012-04-17 DIAGNOSIS — Z8719 Personal history of other diseases of the digestive system: Secondary | ICD-10-CM | POA: Insufficient documentation

## 2012-04-17 DIAGNOSIS — M2540 Effusion, unspecified joint: Secondary | ICD-10-CM | POA: Insufficient documentation

## 2012-04-17 DIAGNOSIS — F17200 Nicotine dependence, unspecified, uncomplicated: Secondary | ICD-10-CM | POA: Insufficient documentation

## 2012-04-17 DIAGNOSIS — Z8739 Personal history of other diseases of the musculoskeletal system and connective tissue: Secondary | ICD-10-CM | POA: Insufficient documentation

## 2012-04-17 DIAGNOSIS — R21 Rash and other nonspecific skin eruption: Secondary | ICD-10-CM | POA: Insufficient documentation

## 2012-04-17 DIAGNOSIS — Z8679 Personal history of other diseases of the circulatory system: Secondary | ICD-10-CM | POA: Insufficient documentation

## 2012-04-17 DIAGNOSIS — IMO0002 Reserved for concepts with insufficient information to code with codable children: Secondary | ICD-10-CM | POA: Insufficient documentation

## 2012-04-18 ENCOUNTER — Encounter (HOSPITAL_COMMUNITY): Payer: Self-pay

## 2012-04-18 ENCOUNTER — Emergency Department (HOSPITAL_COMMUNITY): Payer: Self-pay

## 2012-04-18 ENCOUNTER — Emergency Department (HOSPITAL_COMMUNITY)
Admission: EM | Admit: 2012-04-18 | Discharge: 2012-04-18 | Disposition: A | Discharge status: Home / Self Care | Payer: Self-pay | Attending: Emergency Medicine | Admitting: Emergency Medicine

## 2012-04-18 DIAGNOSIS — L02519 Cutaneous abscess of unspecified hand: Secondary | ICD-10-CM

## 2012-04-18 DIAGNOSIS — IMO0002 Reserved for concepts with insufficient information to code with codable children: Secondary | ICD-10-CM

## 2012-04-18 MED ORDER — KETAMINE HCL 10 MG/ML IJ SOLN
70.0000 mg | Freq: Once | INTRAMUSCULAR | Status: AC
  Administered 2012-04-18: 70 mg via INTRAVENOUS

## 2012-04-18 MED ORDER — VANCOMYCIN HCL IN DEXTROSE 1-5 GM/200ML-% IV SOLN
INTRAVENOUS | Status: AC
  Filled 2012-04-18: qty 200

## 2012-04-18 MED ORDER — MIDAZOLAM HCL 2 MG/2ML IJ SOLN
4.0000 mg | Freq: Once | INTRAMUSCULAR | Status: AC
  Administered 2012-04-18: 4 mg via INTRAVENOUS

## 2012-04-18 MED ORDER — LIDOCAINE HCL (PF) 2 % IJ SOLN
INTRAMUSCULAR | Status: AC
  Administered 2012-04-18: 10 mL
  Filled 2012-04-18: qty 10

## 2012-04-18 MED ORDER — MIDAZOLAM HCL 2 MG/2ML IJ SOLN
4.0000 mg | Freq: Once | INTRAMUSCULAR | Status: AC
  Administered 2012-04-18: 4 mg via INTRAVENOUS
  Filled 2012-04-18: qty 4

## 2012-04-18 MED ORDER — HYDROMORPHONE HCL PF 1 MG/ML IJ SOLN
1.0000 mg | Freq: Once | INTRAMUSCULAR | Status: AC
  Administered 2012-04-18: 1 mg via INTRAMUSCULAR
  Filled 2012-04-18: qty 1

## 2012-04-18 MED ORDER — VANCOMYCIN HCL IN DEXTROSE 1-5 GM/200ML-% IV SOLN
1000.0000 mg | Freq: Once | INTRAVENOUS | Status: AC
  Administered 2012-04-18: 1000 mg via INTRAVENOUS

## 2012-04-18 MED ORDER — LIDOCAINE HCL (PF) 1 % IJ SOLN
INTRAMUSCULAR | Status: AC
  Filled 2012-04-18: qty 5

## 2012-04-18 MED ORDER — HYDROMORPHONE HCL PF 1 MG/ML IJ SOLN
1.0000 mg | Freq: Once | INTRAMUSCULAR | Status: AC
  Administered 2012-04-18: 1 mg via INTRAVENOUS
  Filled 2012-04-18: qty 1

## 2012-04-18 MED ORDER — SULFAMETHOXAZOLE-TRIMETHOPRIM 800-160 MG PO TABS: 1.0000 | ORAL_TABLET | Freq: Two times a day (BID) | ORAL | Status: DC

## 2012-04-18 MED ORDER — KETAMINE HCL 50 MG/ML IJ SOLN
INTRAMUSCULAR | Status: AC
  Filled 2012-04-18: qty 1

## 2012-04-18 MED ORDER — FENTANYL CITRATE 0.05 MG/ML IJ SOLN
INTRAMUSCULAR | Status: AC
  Filled 2012-04-18: qty 2

## 2012-04-18 MED ORDER — FENTANYL CITRATE 0.05 MG/ML IJ SOLN
100.0000 ug | Freq: Once | INTRAMUSCULAR | Status: AC
  Administered 2012-04-18: 100 ug via INTRAVENOUS

## 2012-04-18 MED ORDER — OXYCODONE-ACETAMINOPHEN 5-325 MG PO TABS: 1.0000 | ORAL_TABLET | ORAL | Status: DC | PRN

## 2012-04-18 MED ORDER — MIDAZOLAM HCL 2 MG/2ML IJ SOLN
INTRAMUSCULAR | Status: AC
  Filled 2012-04-18: qty 4

## 2012-04-18 NOTE — ED Notes (Signed)
Pt alert & oriented x4, stable gait. Patient given discharge instructions, paperwork & prescription(s). Patient  instructed to stop at the registration desk to finish any additional paperwork. Patient verbalized understanding. Pt left department w/ no further questions. 

## 2012-04-18 NOTE — ED Notes (Signed)
Pt states got a splinter under her nail 4 days ago, has become swollen, red & painful.

## 2012-04-18 NOTE — ED Provider Notes (Addendum)
History     CSN: [id number]  Arrival date & time 04/17/12  2347   First MD Initiated Contact with Patient 04/18/12 [phone]      Chief Complaint  Patient presents with  . finger infection     (Consider location/radiation/quality/duration/timing/severity/associated sxs/prior treatment) HPI Comments: 42 year old female who presents with left thumb pain. She states that 5 days ago she was doing some home medications when she felt a sharp pain to the distal tip of her left thumb which she felt was probably a splinter from a sharp object.  The next 2 days for pain free then she started to have gradual onset of pain which became acutely swollen and severely tender in the last 24 hours. It has also become discolored and appears purulent especially the nail bed which appears white and purulent as well. She denies fevers or chills but states that the pain extends from her tip of the left thumb into the left hand on the radial surface. There is no redness or streaking proximally.  The history is provided by the patient and the spouse.    Past Medical History  Diagnosis Date  . Celiac disease   . Back pain   . Tachycardia   . Nerve compression   . Arthritis   . Degeneration of cervical intervertebral disc     History reviewed. No pertinent past surgical history.  Family History  Problem Relation Age of Onset  . Cancer Other     History  Substance Use Topics  . Smoking status: Current Every Day Smoker -- 1.0 packs/day for 28 years    Types: Cigarettes  . Smokeless tobacco: Never Used  . Alcohol Use: No    OB History    Grav Para Term Preterm Abortions TAB SAB Ect Mult Living   3 2   1  1   2       Review of Systems  Constitutional: Negative for fever and chills.  Gastrointestinal: Negative for nausea and vomiting.  Musculoskeletal: Positive for joint swelling.  Skin: Positive for rash and wound.    Allergies  Cephalexin; Erythromycin; Gluten; Tramadol; Wheat; and  Penicillins  Home Medications   Current Outpatient Rx  Name  Route  Sig  Dispense  Refill  . ACETAMINOPHEN 325 MG PO TABS   Oral   Take 650 mg by mouth every 6 (six) hours as needed.         Marland Kitchen PREDNISONE 10 MG PO TABS      Take 6 tablets day one, 5 tablets day two, 4 tablets day three, 3 tablets day four, 2 tablets day five, then 1 tablet day six   21 tablet   0     BP 146/78  Pulse 91  Temp 98.6 F (37 C) (Oral)  Resp 13  Ht 5' 4.75" (1.645 m)  Wt 145 lb 6 oz (65.942 kg)  BMI 24.38 kg/m2  SpO2 98%  LMP 04/13/2012  Physical Exam  Nursing note and vitals reviewed. Constitutional: She appears well-developed and well-nourished. No distress.  HENT:  Head: Normocephalic and atraumatic.  Mouth/Throat: No oropharyngeal exudate.  Eyes: Conjunctivae normal are normal. Right eye exhibits no discharge. Left eye exhibits no discharge. No scleral icterus.  Cardiovascular: Normal rate, regular rhythm, normal heart sounds and intact distal pulses.  Exam reveals no gallop and no friction rub.   No murmur heard. Pulmonary/Chest: Effort normal and breath sounds normal. No respiratory distress. She has no wheezes. She has no rales.  Musculoskeletal: Normal range  of motion. She exhibits tenderness ( Focal tenderness to palpation over the left thumb). She exhibits no edema.  Neurological: She is alert. Coordination normal.       Sensory deficit to the tip of the left thumb  Skin: Skin is warm and dry.       There is a felon, with what appears to be a subungual abscess of the left thumb  Psychiatric: She has a normal mood and affect. Her behavior is normal.    ED Course  Procedures (including critical care time)  Labs Reviewed - No data to display Dg Finger Thumb Left  04/18/2012  *RADIOLOGY REPORT*  Clinical Data: Severe swelling and discoloration the tip of first finger after wood jammed the under left thumb nail.  LEFT THUMB 2+V  Comparison: None.  Findings: The left first finger  appears intact.  No evidence of acute fracture or subluxation.  No focal bone lesion or bone destruction.  Bone cortex and trabecular architecture appear intact.  No radiopaque soft tissue foreign bodies or soft tissue gas collections identified.  Note that wood fragments may not be radiopaque and may not be visible on radiographs.  IMPRESSION: No acute bony or soft tissue abnormality identified.   Original Report Authenticated By: Burman Nieves, M.D.      1. Abscess of finger   2. Felon       MDM  The patient is not have any significant streaking or redness or swelling proximal to the interphalangeal joint of the left thumb. She does not appear toxic and has normal vital signs. She does have a significant infection, rule out foreign body with x-ray, a digital block, removal of nail  Pt has not tolerated procedural sedation or digital block well and still has pain and has not had sedation allowing the procedure to continue.  i was able to drain the subungual abscess but due to intolerance of pain the procedure was stopped.  Will have f/u with hand.    INCISION AND DRAINAGE Performed by: Eber Hong D Consent: Verbal consent obtained. Risks and benefits: risks, benefits and alternatives were discussed Type: abscess  Body area: L thumbnail subungual abscess  Anesthesia: local infiltration  Incision was made with a scalpel.  Local anesthetic: lidocaine 1 % without epinephrine  Anesthetic total: 6 ml  Complexity: simple - tip of scissors placed under distal nail with removal of moderate amount of blood and pus.  Drainage: purulent  Drainage amount: moderate  Packing material: None  Patient tolerance: Patient tolerated the procedure well with no immediate complications.  Procedural sedation Performed by: Vida Roller Consent: Verbal consent obtained. Risks and benefits: risks, benefits and alternatives were discussed Required items: required blood products, implants,  devices, and special equipment available Patient identity confirmed: arm band and provided demographic data Time out: Immediately prior to procedure a "time out" was called to verify the correct patient, procedure, equipment, support staff and site/side marked as required.  Sedation type: mild (conscious) sedation NPO time confirmed and considedered  Sedatives: KETAMINE, Versed (Fentanyl for pain)  Physician Time at Bedside: 30 minutes  Vitals: Vital signs were monitored during sedation. Cardiac Monitor, pulse oximeter Patient tolerance: Patient tolerated the procedure well with no immediate complications. Comments: Pt with uneventful recovered. Returned to pre-procedural sedation baseline   The patient had 70mg  of Ketamine, 8mg  of Versed and of Fentanyl and was still carrying on Lucid conversation.  Vancomycin given, pt to f/u with Hand today.  20 - d/w Dr. Mina Marble - will  see in Office on Tuesday - will have do soaks, and abx in agreement with hand specialist.  Pt is non toxic and afebrile.   Vida Roller, MD 04/18/12 1610  Vida Roller, MD 04/18/12 [phone]

## 2012-04-18 NOTE — ED Notes (Signed)
Pt had a piece of wood jammed up under her left thumb nail on Wednesday last week, has severe swelling, and blackened area on tip of finger.

## 2012-04-19 ENCOUNTER — Encounter (HOSPITAL_BASED_OUTPATIENT_CLINIC_OR_DEPARTMENT_OTHER): Payer: Self-pay | Admitting: *Deleted

## 2012-04-19 ENCOUNTER — Other Ambulatory Visit: Payer: Self-pay | Admitting: Orthopedic Surgery

## 2012-04-19 NOTE — Progress Notes (Signed)
Denies any heart or resp problems Does smoke Hx tachy HR -no cardiac seen-was stress-no meds

## 2012-04-20 ENCOUNTER — Ambulatory Visit (HOSPITAL_BASED_OUTPATIENT_CLINIC_OR_DEPARTMENT_OTHER)
Admission: RE | Admit: 2012-04-20 | Discharge: 2012-04-20 | Disposition: A | Discharge status: Home / Self Care | Payer: Self-pay | Source: Ambulatory Visit | Attending: Orthopedic Surgery | Admitting: Orthopedic Surgery

## 2012-04-20 ENCOUNTER — Encounter (HOSPITAL_BASED_OUTPATIENT_CLINIC_OR_DEPARTMENT_OTHER): Payer: Self-pay | Admitting: Anesthesiology

## 2012-04-20 ENCOUNTER — Encounter (HOSPITAL_BASED_OUTPATIENT_CLINIC_OR_DEPARTMENT_OTHER)
Admission: RE | Disposition: A | Discharge status: Home / Self Care | Payer: Self-pay | Source: Ambulatory Visit | Attending: Orthopedic Surgery

## 2012-04-20 ENCOUNTER — Ambulatory Visit (HOSPITAL_BASED_OUTPATIENT_CLINIC_OR_DEPARTMENT_OTHER): Payer: Self-pay | Admitting: Anesthesiology

## 2012-04-20 ENCOUNTER — Encounter (HOSPITAL_BASED_OUTPATIENT_CLINIC_OR_DEPARTMENT_OTHER): Payer: Self-pay | Admitting: *Deleted

## 2012-04-20 DIAGNOSIS — Z881 Allergy status to other antibiotic agents status: Secondary | ICD-10-CM | POA: Insufficient documentation

## 2012-04-20 DIAGNOSIS — IMO0002 Reserved for concepts with insufficient information to code with codable children: Secondary | ICD-10-CM | POA: Insufficient documentation

## 2012-04-20 DIAGNOSIS — L03019 Cellulitis of unspecified finger: Secondary | ICD-10-CM | POA: Insufficient documentation

## 2012-04-20 DIAGNOSIS — M129 Arthropathy, unspecified: Secondary | ICD-10-CM | POA: Insufficient documentation

## 2012-04-20 DIAGNOSIS — F17200 Nicotine dependence, unspecified, uncomplicated: Secondary | ICD-10-CM | POA: Insufficient documentation

## 2012-04-20 DIAGNOSIS — Z91018 Allergy to other foods: Secondary | ICD-10-CM | POA: Insufficient documentation

## 2012-04-20 DIAGNOSIS — Z880 Allergy status to penicillin: Secondary | ICD-10-CM | POA: Insufficient documentation

## 2012-04-20 DIAGNOSIS — M5030 Other cervical disc degeneration, unspecified cervical region: Secondary | ICD-10-CM | POA: Insufficient documentation

## 2012-04-20 HISTORY — PX: I&D EXTREMITY: SHX5045

## 2012-04-20 LAB — POCT HEMOGLOBIN-HEMACUE: Hemoglobin: 14.7 g/dL (ref 12.0–15.0)

## 2012-04-20 SURGERY — IRRIGATION AND DEBRIDEMENT EXTREMITY
Anesthesia: General | Site: Thumb | Laterality: Left | Wound class: Dirty or Infected

## 2012-04-20 MED ORDER — MIDAZOLAM HCL 5 MG/5ML IJ SOLN
INTRAMUSCULAR | Status: DC | PRN
  Administered 2012-04-20: 2 mg via INTRAVENOUS

## 2012-04-20 MED ORDER — LACTATED RINGERS IV SOLN
INTRAVENOUS | Status: DC
  Administered 2012-04-20 (×2): via INTRAVENOUS

## 2012-04-20 MED ORDER — BUPIVACAINE HCL (PF) 0.25 % IJ SOLN
INTRAMUSCULAR | Status: DC | PRN
  Administered 2012-04-20: 7 mL

## 2012-04-20 MED ORDER — FENTANYL CITRATE 0.05 MG/ML IJ SOLN
INTRAMUSCULAR | Status: DC | PRN
  Administered 2012-04-20: 25 ug via INTRAVENOUS
  Administered 2012-04-20: 100 ug via INTRAVENOUS
  Administered 2012-04-20: 50 ug via INTRAVENOUS
  Administered 2012-04-20: 25 ug via INTRAVENOUS

## 2012-04-20 MED ORDER — HYDROMORPHONE HCL PF 1 MG/ML IJ SOLN
0.2500 mg | INTRAMUSCULAR | Status: DC | PRN
  Administered 2012-04-20 (×4): 0.5 mg via INTRAVENOUS

## 2012-04-20 MED ORDER — OXYCODONE HCL 5 MG/5ML PO SOLN: 5.0000 mg | Freq: Once | ORAL | Status: DC | PRN

## 2012-04-20 MED ORDER — CHLORHEXIDINE GLUCONATE 4 % EX LIQD: 60.0000 mL | Freq: Once | CUTANEOUS | Status: DC

## 2012-04-20 MED ORDER — OXYCODONE-ACETAMINOPHEN 5-325 MG PO TABS: 1.0000 | ORAL_TABLET | ORAL | Status: DC | PRN

## 2012-04-20 MED ORDER — PROPOFOL 10 MG/ML IV BOLUS
INTRAVENOUS | Status: DC | PRN
  Administered 2012-04-20: 200 mg via INTRAVENOUS

## 2012-04-20 MED ORDER — OXYCODONE HCL 5 MG PO TABS: 5.0000 mg | ORAL_TABLET | Freq: Once | ORAL | Status: DC | PRN

## 2012-04-20 MED ORDER — LIDOCAINE HCL (CARDIAC) 20 MG/ML IV SOLN
INTRAVENOUS | Status: DC | PRN
  Administered 2012-04-20: 100 mg via INTRAVENOUS

## 2012-04-20 MED ORDER — ONDANSETRON HCL 4 MG/2ML IJ SOLN
INTRAMUSCULAR | Status: DC | PRN
  Administered 2012-04-20: 4 mg via INTRAVENOUS

## 2012-04-20 SURGICAL SUPPLY — 65 items
APL SKNCLS STERI-STRIP NONHPOA (GAUZE/BANDAGES/DRESSINGS)
BAG DECANTER FOR FLEXI CONT (MISCELLANEOUS) IMPLANT
BANDAGE ELASTIC 3 VELCRO ST LF (GAUZE/BANDAGES/DRESSINGS) IMPLANT
BANDAGE ELASTIC 4 VELCRO ST LF (GAUZE/BANDAGES/DRESSINGS) IMPLANT
BANDAGE GAUZE ELAST BULKY 4 IN (GAUZE/BANDAGES/DRESSINGS) IMPLANT
BENZOIN TINCTURE PRP APPL 2/3 (GAUZE/BANDAGES/DRESSINGS) IMPLANT
BLADE SURG 15 STRL LF DISP TIS (BLADE) ×1 IMPLANT
BLADE SURG 15 STRL SS (BLADE) ×2
BNDG CMPR 9X4 STRL LF SNTH (GAUZE/BANDAGES/DRESSINGS) ×1
BNDG COHESIVE 1X5 TAN STRL LF (GAUZE/BANDAGES/DRESSINGS) ×2 IMPLANT
BNDG ESMARK 4X9 LF (GAUZE/BANDAGES/DRESSINGS) ×2 IMPLANT
CLOTH BEACON ORANGE TIMEOUT ST (SAFETY) ×2 IMPLANT
CORDS BIPOLAR (ELECTRODE) ×2 IMPLANT
COVER TABLE BACK 60X90 (DRAPES) ×2 IMPLANT
CUFF TOURNIQUET SINGLE 18IN (TOURNIQUET CUFF) ×2 IMPLANT
DECANTER SPIKE VIAL GLASS SM (MISCELLANEOUS) IMPLANT
DRAPE EXTREMITY T 121X128X90 (DRAPE) ×2 IMPLANT
DRAPE SURG 17X23 STRL (DRAPES) ×2 IMPLANT
DURAPREP 26ML APPLICATOR (WOUND CARE) ×2 IMPLANT
GAUZE PACKING IODOFORM 1/4X5 (PACKING) IMPLANT
GAUZE XEROFORM 1X8 LF (GAUZE/BANDAGES/DRESSINGS) ×2 IMPLANT
GLOVE BIO SURGEON STRL SZ 6.5 (GLOVE) ×2 IMPLANT
GLOVE BIO SURGEON STRL SZ8.5 (GLOVE) ×2 IMPLANT
GLOVE BIOGEL PI IND STRL 7.0 (GLOVE) ×1 IMPLANT
GLOVE BIOGEL PI INDICATOR 7.0 (GLOVE) ×1
GOWN PREVENTION PLUS XLARGE (GOWN DISPOSABLE) ×2 IMPLANT
GOWN PREVENTION PLUS XXLARGE (GOWN DISPOSABLE) ×2 IMPLANT
HANDPIECE INTERPULSE COAX TIP (DISPOSABLE)
IV NS IRRIG 3000ML ARTHROMATIC (IV SOLUTION) IMPLANT
LOOP VESSEL MAXI BLUE (MISCELLANEOUS) IMPLANT
NEEDLE 27GAX1X1/2 (NEEDLE) ×2 IMPLANT
NEEDLE HYPO 25X1 1.5 SAFETY (NEEDLE) IMPLANT
NS IRRIG 1000ML POUR BTL (IV SOLUTION) IMPLANT
PACK BASIN DAY SURGERY FS (CUSTOM PROCEDURE TRAY) ×2 IMPLANT
PAD CAST 3X4 CTTN HI CHSV (CAST SUPPLIES) IMPLANT
PADDING CAST ABS 3INX4YD NS (CAST SUPPLIES)
PADDING CAST ABS 4INX4YD NS (CAST SUPPLIES) ×1
PADDING CAST ABS COTTON 3X4 (CAST SUPPLIES) IMPLANT
PADDING CAST ABS COTTON 4X4 ST (CAST SUPPLIES) ×1 IMPLANT
PADDING CAST COTTON 3X4 STRL (CAST SUPPLIES)
SET HNDPC FAN SPRY TIP SCT (DISPOSABLE) IMPLANT
SHEET MEDIUM DRAPE 40X70 STRL (DRAPES) ×2 IMPLANT
SPLINT PLASTER CAST XFAST 3X15 (CAST SUPPLIES) IMPLANT
SPLINT PLASTER CAST XFAST 4X15 (CAST SUPPLIES) ×5 IMPLANT
SPLINT PLASTER XTRA FAST SET 4 (CAST SUPPLIES) ×5
SPLINT PLASTER XTRA FASTSET 3X (CAST SUPPLIES)
SPONGE GAUZE 4X4 12PLY (GAUZE/BANDAGES/DRESSINGS) ×2 IMPLANT
STOCKINETTE 4X48 STRL (DRAPES) ×2 IMPLANT
STRIP CLOSURE SKIN 1/2X4 (GAUZE/BANDAGES/DRESSINGS) IMPLANT
SUT ETHILON 3 0 PS 1 (SUTURE) IMPLANT
SUT ETHILON 5 0 PS 2 18 (SUTURE) IMPLANT
SUT PROLENE 3 0 PS 2 (SUTURE) IMPLANT
SUT VIC AB 4-0 P-3 18XBRD (SUTURE) IMPLANT
SUT VIC AB 4-0 P3 18 (SUTURE)
SUT VICRYL RAPIDE 4/0 PS 2 (SUTURE) IMPLANT
SWAB COLLECTION DEVICE MRSA (MISCELLANEOUS) IMPLANT
SYR BULB 3OZ (MISCELLANEOUS) ×2 IMPLANT
SYR CONTROL 10ML LL (SYRINGE) ×2 IMPLANT
SYRINGE 10CC LL (SYRINGE) ×2 IMPLANT
TOWEL OR 17X24 6PK STRL BLUE (TOWEL DISPOSABLE) ×2 IMPLANT
TUBE ANAEROBIC SPECIMEN COL (MISCELLANEOUS) ×2 IMPLANT
TUBE CONNECTING 20X1/4 (TUBING) IMPLANT
UNDERPAD 30X30 INCONTINENT (UNDERPADS AND DIAPERS) ×2 IMPLANT
WATER STERILE IRR 1000ML POUR (IV SOLUTION) ×2 IMPLANT
YANKAUER SUCT BULB TIP NO VENT (SUCTIONS) IMPLANT

## 2012-04-20 NOTE — H&P (Signed)
Lisa Mcclain is an 42 y.o. female.   Chief Complaint: left thumb pain and swelling HPI: as above with chronic left thumb swelling c/w felon  Past Medical History  Diagnosis Date  . Celiac disease   . Back pain   . Tachycardia   . Nerve compression   . Arthritis   . Degeneration of cervical intervertebral disc     History reviewed. No pertinent past surgical history.  Family History  Problem Relation Age of Onset  . Cancer Other    Social History:  reports that she has been smoking Cigarettes.  She has a 28 pack-year smoking history. She has never used smokeless tobacco. She reports that she does not drink alcohol or use illicit drugs.  Allergies:  Allergies  Allergen Reactions  . Cephalexin Other (See Comments)    Due to gluten content  . Erythromycin Nausea And Vomiting  . Gluten   . Tramadol Other (See Comments)    headache  . Wheat     Allergic to all wheat products  . Penicillins Rash    Medications Prior to Admission  Medication Sig Dispense Refill  . acetaminophen (TYLENOL) 325 MG tablet Take 650 mg by mouth every 6 (six) hours as needed.      Marland Kitchen omeprazole (PRILOSEC) 20 MG capsule Take 20 mg by mouth daily.      Marland Kitchen sulfamethoxazole-trimethoprim (SEPTRA DS) 800-160 MG per tablet Take 1 tablet by mouth every 12 (twelve) hours.  20 tablet  0  . oxyCODONE-acetaminophen (PERCOCET) 5-325 MG per tablet Take 1 tablet by mouth every 4 (four) hours as needed for pain.  20 tablet  0    No results found for this or any previous visit (from the past 48 hour(s)). No results found.  Review of Systems  All other systems reviewed and are negative.    Blood pressure 135/82, pulse 98, temperature 98.3 F (36.8 C), temperature source Oral, resp. rate 16, height 5\' 5"  (1.651 m), weight 73.483 kg (162 lb), last menstrual period 04/13/2012, SpO2 98.00%. Physical Exam  Constitutional: She is oriented to person, place, and time. She appears well-developed and well-nourished.    HENT:  Head: Normocephalic and atraumatic.  Respiratory: Effort normal.  Musculoskeletal:       Left hand: She exhibits swelling.       Hands: Neurological: She is alert and oriented to person, place, and time.  Skin: Skin is warm.  Psychiatric: She has a normal mood and affect. Her behavior is normal. Judgment and thought content normal.     Assessment/Plan As above  Plan I and D  Lisa Mcclain A 04/20/2012, 12:23 PM

## 2012-04-20 NOTE — Op Note (Signed)
See note [phone]

## 2012-04-20 NOTE — Anesthesia Procedure Notes (Signed)
Procedure Name: LMA Insertion Date/Time: 04/20/2012 12:55 PM Performed by: Caren Macadam Pre-anesthesia Checklist: Patient identified, Emergency Drugs available, Suction available and Patient being monitored Patient Re-evaluated:Patient Re-evaluated prior to inductionOxygen Delivery Method: Circle System Utilized Preoxygenation: Pre-oxygenation with 100% oxygen Intubation Type: IV induction Ventilation: Mask ventilation without difficulty LMA: LMA inserted LMA Size: 4.0 Number of attempts: 1 Airway Equipment and Method: bite block Placement Confirmation: positive ETCO2 and breath sounds checked- equal and bilateral Tube secured with: Tape Dental Injury: Teeth and Oropharynx as per pre-operative assessment

## 2012-04-20 NOTE — Transfer of Care (Signed)
Immediate Anesthesia Transfer of Care Note  Patient: Lisa Mcclain  Procedure(s) Performed: Procedure(s) (LRB) with comments: IRRIGATION AND DEBRIDEMENT EXTREMITY (Left)  Patient Location: PACU  Anesthesia Type:General  Level of Consciousness: awake and alert   Airway & Oxygen Therapy: Patient Spontanous Breathing and Patient connected to face mask oxygen  Post-op Assessment: Report given to PACU RN and Post -op Vital signs reviewed and stable  Post vital signs: Reviewed and stable  Complications: No apparent anesthesia complications

## 2012-04-20 NOTE — Anesthesia Preprocedure Evaluation (Addendum)
Anesthesia Evaluation  Patient identified by MRN, date of birth, ID band Patient awake    Reviewed: Allergy & Precautions, H&P , NPO status , Patient's Chart, lab work & pertinent test results  Airway Mallampati: II TM Distance: >3 FB Neck ROM: Full    Dental No notable dental hx. (+) Teeth Intact and Dental Advisory Given   Pulmonary Current Smoker,  breath sounds clear to auscultation  Pulmonary exam normal       Cardiovascular negative cardio ROS  Rhythm:Regular Rate:Normal     Neuro/Psych negative neurological ROS  negative psych ROS   GI/Hepatic negative GI ROS, Neg liver ROS,   Endo/Other  negative endocrine ROS  Renal/GU negative Renal ROS  negative genitourinary   Musculoskeletal   Abdominal   Peds  Hematology negative hematology ROS (+)   Anesthesia Other Findings   Reproductive/Obstetrics negative OB ROS                           Anesthesia Physical Anesthesia Plan  ASA: II  Anesthesia Plan: General   Post-op Pain Management:    Induction: Intravenous  Airway Management Planned: LMA  Additional Equipment:   Intra-op Plan:   Post-operative Plan: Extubation in OR  Informed Consent: I have reviewed the patients History and Physical, chart, labs and discussed the procedure including the risks, benefits and alternatives for the proposed anesthesia with the patient or authorized representative who has indicated his/her understanding and acceptance.   Dental advisory given  Plan Discussed with: CRNA  Anesthesia Plan Comments:         Anesthesia Quick Evaluation  

## 2012-04-20 NOTE — Brief Op Note (Signed)
04/20/2012  1:14 PM  PATIENT:  Lisa Mcclain  42 y.o. female  PRE-OPERATIVE DIAGNOSIS:  LEFT THUMB FELON  POST-OPERATIVE DIAGNOSIS:  * No post-op diagnosis entered *  PROCEDURE:  Procedure(s) (LRB) with comments: IRRIGATION AND DEBRIDEMENT EXTREMITY (Left)  SURGEON:  Surgeon(s) and Role:    * Marlowe Shores, MD - Primary  PHYSICIAN ASSISTANT:   ASSISTANTS: none   ANESTHESIA:   general  EBL:  Total I/O In: 400 [I.V.:400] Out: -   BLOOD ADMINISTERED:none  DRAINS: none   LOCAL MEDICATIONS USED:  MARCAINE   7cc  SPECIMEN:  No Specimen  DISPOSITION OF SPECIMEN:  N/A  COUNTS:  YES  TOURNIQUET:   Total Tourniquet Time Documented: Upper Arm (Left) - 9 minutes  DICTATION: .Other Dictation: Dictation Number [id number]  PLAN OF CARE: Discharge to home after PACU  PATIENT DISPOSITION:  PACU - hemodynamically stable.   Delay start of Pharmacological VTE agent (>24hrs) due to surgical blood loss or risk of bleeding: not applicable

## 2012-04-20 NOTE — Anesthesia Postprocedure Evaluation (Signed)
  Anesthesia Post-op Note  Patient: Lisa Mcclain  Procedure(s) Performed: Procedure(s) (LRB) with comments: IRRIGATION AND DEBRIDEMENT EXTREMITY (Left)  Patient Location: PACU  Anesthesia Type:General  Level of Consciousness: awake, alert  and oriented  Airway and Oxygen Therapy: Patient Spontanous Breathing and Patient connected to face mask oxygen  Post-op Pain: mild  Post-op Assessment: Post-op Vital signs reviewed, Patient's Cardiovascular Status Stable, Respiratory Function Stable, Patent Airway and No signs of Nausea or vomiting  Post-op Vital Signs: Reviewed and stable  Complications: No apparent anesthesia complications

## 2012-04-21 NOTE — Op Note (Signed)
Lisa Mcclain, Lisa Mcclain                ACCOUNT NO.:  [id number]  MEDICAL RECORD NO.:  [id number]  LOCATION:                                 FACILITY:  PHYSICIAN:  Artist Pais. Vyolet Sakuma, M.D.DATE OF BIRTH:  [birth year]  DATE OF PROCEDURE:  04/20/2012 DATE OF DISCHARGE:                              OPERATIVE REPORT   PREOPERATIVE DIAGNOSES: 1. Chronic felon, left thumb. 2. Chronic paronychia, left thumb.  POSTOPERATIVE DIAGNOSES: 1. Chronic felon, left thumb. 2. Chronic paronychia, left thumb.  PROCEDURE:  I and D'd above.  SURGEON:  Artist Pais. Mina Marble, M.D.  ASSISTANT:  None.  ANESTHESIA:  General.  COMPLICATIONS:  None.  DRAINS:  None.  DESCRIPTION OF PROCEDURE:  The patient was taken to the operating suite. After induction of adequate general anesthesia, left upper extremity was prepped and draped in usual sterile fashion.  Esmarch was used to exsanguinate the limb.  Tourniquet inflated to 250 mmHg.  At this point in time, the left thumb was approached in midlateral fashion along the radial border.  Skin was incised in the IP flexion crease distally. Felon was encountered.  Purulence was encountered.  It was cultured. There was also significant purulence under the nail.  The nail was lifted off the nail bed.  There was significant scarring of the nail bed with chronic inflamed tissue which was all debrided.  The wound was then thoroughly irrigated with a liter of normal saline and then packed open with 1 x 8 Xeroform gauze, 4x4s, and Coban wrap was applied.  The patient tolerated the procedure well and went to the recovery room in stable fashion.    Artist Pais Mina Marble, M.D.    MAW/MEDQ  D:  04/20/2012  T:  04/21/2012  Job:  [id number]

## 2012-04-22 ENCOUNTER — Encounter (HOSPITAL_BASED_OUTPATIENT_CLINIC_OR_DEPARTMENT_OTHER): Payer: Self-pay | Admitting: Orthopedic Surgery

## 2012-04-23 LAB — WOUND CULTURE

## 2012-04-25 LAB — ANAEROBIC CULTURE

## 2012-09-01 ENCOUNTER — Emergency Department (HOSPITAL_COMMUNITY): Payer: Self-pay

## 2012-09-01 ENCOUNTER — Emergency Department (HOSPITAL_COMMUNITY)
Admission: EM | Admit: 2012-09-01 | Discharge: 2012-09-01 | Disposition: A | Discharge status: Home / Self Care | Payer: Self-pay | Attending: Emergency Medicine | Admitting: Emergency Medicine

## 2012-09-01 ENCOUNTER — Encounter (HOSPITAL_COMMUNITY): Payer: Self-pay | Admitting: Emergency Medicine

## 2012-09-01 DIAGNOSIS — F17200 Nicotine dependence, unspecified, uncomplicated: Secondary | ICD-10-CM | POA: Insufficient documentation

## 2012-09-01 DIAGNOSIS — Z8669 Personal history of other diseases of the nervous system and sense organs: Secondary | ICD-10-CM | POA: Insufficient documentation

## 2012-09-01 DIAGNOSIS — Z8679 Personal history of other diseases of the circulatory system: Secondary | ICD-10-CM | POA: Insufficient documentation

## 2012-09-01 DIAGNOSIS — M5416 Radiculopathy, lumbar region: Secondary | ICD-10-CM

## 2012-09-01 DIAGNOSIS — Z880 Allergy status to penicillin: Secondary | ICD-10-CM | POA: Insufficient documentation

## 2012-09-01 DIAGNOSIS — Z8739 Personal history of other diseases of the musculoskeletal system and connective tissue: Secondary | ICD-10-CM | POA: Insufficient documentation

## 2012-09-01 DIAGNOSIS — Z8719 Personal history of other diseases of the digestive system: Secondary | ICD-10-CM | POA: Insufficient documentation

## 2012-09-01 DIAGNOSIS — M129 Arthropathy, unspecified: Secondary | ICD-10-CM | POA: Insufficient documentation

## 2012-09-01 DIAGNOSIS — M25552 Pain in left hip: Secondary | ICD-10-CM

## 2012-09-01 DIAGNOSIS — M25569 Pain in unspecified knee: Secondary | ICD-10-CM | POA: Insufficient documentation

## 2012-09-01 DIAGNOSIS — IMO0002 Reserved for concepts with insufficient information to code with codable children: Secondary | ICD-10-CM | POA: Insufficient documentation

## 2012-09-01 HISTORY — DX: Sciatica, unspecified side: M54.30

## 2012-09-01 MED ORDER — NAPROXEN 500 MG PO TABS: 500.0000 mg | ORAL_TABLET | Freq: Two times a day (BID) | ORAL | Status: DC

## 2012-09-01 MED ORDER — OXYCODONE-ACETAMINOPHEN 5-325 MG PO TABS: 1.0000 | ORAL_TABLET | ORAL | Status: DC | PRN

## 2012-09-01 MED ORDER — HYDROCODONE-ACETAMINOPHEN 5-325 MG PO TABS
2.0000 | ORAL_TABLET | Freq: Once | ORAL | Status: AC
  Administered 2012-09-01: 2 via ORAL
  Filled 2012-09-01: qty 2

## 2012-09-01 MED ORDER — CYCLOBENZAPRINE HCL 10 MG PO TABS
10.0000 mg | ORAL_TABLET | Freq: Three times a day (TID) | ORAL | Status: DC | PRN
Start: 2012-09-01 — End: 2012-11-24

## 2012-09-01 NOTE — ED Provider Notes (Signed)
History     CSN: [id number]  Arrival date & time 09/01/12  1253   First MD Initiated Contact with Patient 09/01/12 1319      Chief Complaint  Patient presents with  . Hip Pain    (Consider location/radiation/quality/duration/timing/severity/associated sxs/prior treatment) HPI Comments: Patient with hx of recurrent sciatica, c/o pain to her left lower back and hip for one week.  States the pain is sharp and radiates to her left thigh.  PAin is worse with standing or walking and improves with rest.  She denies incontinence of bladder or bowel, abd pain, dysuria, fever or vomiting.  States the pain is similar to previus episodes , but feels like it is more severe in the left hip than usual.    Patient is a 42 y.o. female presenting with hip pain. The history is provided by the patient.  Hip Pain This is a recurrent problem. The current episode started 1 to 4 weeks ago. The problem occurs constantly. The problem has been gradually worsening. Associated symptoms include arthralgias. Pertinent negatives include no abdominal pain, change in bowel habit, chest pain, chills, fever, headaches, joint swelling, myalgias, nausea, neck pain, numbness, rash, urinary symptoms, visual change, vomiting or weakness. The symptoms are aggravated by twisting, walking and standing. She has tried NSAIDs for the symptoms. The treatment provided no relief.    Past Medical History  Diagnosis Date  . Celiac disease   . Back pain   . Tachycardia   . Nerve compression   . Arthritis   . Degeneration of cervical intervertebral disc   . Sciatica     Past Surgical History  Procedure Laterality Date  . I&d extremity  04/20/2012    Procedure: IRRIGATION AND DEBRIDEMENT EXTREMITY;  Surgeon: Marlowe Shores, MD;  Location: [facility];  Service: Orthopedics;  Laterality: Left;    Family History  Problem Relation Age of Onset  . Cancer Other     History  Substance Use Topics  . Smoking status:  Current Every Day Smoker -- 1.00 packs/day for 28 years    Types: Cigarettes  . Smokeless tobacco: Never Used  . Alcohol Use: No    OB History   Grav Para Term Preterm Abortions TAB SAB Ect Mult Living   3 2   1  1   2       Review of Systems  Constitutional: Negative for fever and chills.  HENT: Negative for neck pain.   Respiratory: Negative for shortness of breath.   Cardiovascular: Negative for chest pain.  Gastrointestinal: Negative for nausea, vomiting, abdominal pain, constipation and change in bowel habit.  Genitourinary: Negative for dysuria, hematuria, flank pain, decreased urine volume, difficulty urinating and pelvic pain.       No perineal numbness or incontinence of urine or feces  Musculoskeletal: Positive for back pain and arthralgias. Negative for myalgias and joint swelling.  Skin: Negative for rash.  Neurological: Negative for weakness, numbness and headaches.  All other systems reviewed and are negative.    Allergies  Cephalexin; Erythromycin; Gluten; Tramadol; Wheat; and Penicillins  Home Medications   Current Outpatient Rx  Name  Route  Sig  Dispense  Refill  . ibuprofen (ADVIL,MOTRIN) 200 MG tablet   Oral   Take 400 mg by mouth every 6 (six) hours as needed for pain.         . cyclobenzaprine (FLEXERIL) 10 MG tablet   Oral   Take 1 tablet (10 mg total) by mouth 3 (three)  times daily as needed.   21 tablet   0   . naproxen (NAPROSYN) 500 MG tablet   Oral   Take 1 tablet (500 mg total) by mouth 2 (two) times daily with a meal.   20 tablet   0   . oxyCODONE-acetaminophen (PERCOCET/ROXICET) 5-325 MG per tablet   Oral   Take 1 tablet by mouth every 4 (four) hours as needed for pain.   20 tablet   0     BP 120/71  Pulse 112  Temp(Src) 98.4 F (36.9 C) (Oral)  Resp 18  Ht 5' 4.5" (1.638 m)  Wt 160 lb (72.576 kg)  BMI 27.05 kg/m2  SpO2 100%  LMP 08/18/2012  Physical Exam  Nursing note and vitals reviewed. Constitutional: She is  oriented to person, place, and time. She appears well-developed and well-nourished. No distress.  HENT:  Head: Normocephalic and atraumatic.  Neck: Normal range of motion. Neck supple.  Cardiovascular: Normal rate, regular rhythm, normal heart sounds and intact distal pulses.   No murmur heard. Pulmonary/Chest: Effort normal and breath sounds normal. No respiratory distress.  Musculoskeletal: She exhibits tenderness. She exhibits no edema.       Lumbar back: She exhibits tenderness and pain. She exhibits normal range of motion, no swelling, no deformity, no laceration and normal pulse.  ttp of the left lumbar paraspinal muscles and left SI joint.  No spinal tenderness.  DP pulses are brisk and symmetrical.  Distal sensation intact.  Hip Flexors/Extensors are intact  Neurological: She is alert and oriented to person, place, and time. No cranial nerve deficit or sensory deficit. She exhibits normal muscle tone. Coordination and gait normal.  Reflex Scores:      Patellar reflexes are 2+ on the right side and 2+ on the left side.      Achilles reflexes are 2+ on the right side and 2+ on the left side. Skin: Skin is warm and dry.    ED Course  Procedures (including critical care time)  Labs Reviewed - No data to display Dg Hip Complete Left  09/01/2012   *RADIOLOGY REPORT*  Clinical Data: Left hip pain  LEFT HIP - COMPLETE 2+ VIEW  Comparison: None.  Findings: Hip joint spaces are maintained.  No acute fracture or dislocation.  Minimal degenerative change of the SI joints. Prominent degenerative changes in the lower lumbar spine.  IMPRESSION: No acute bony pathology.  Degenerative changes are most prominent in the lumbar spine.   Original Report Authenticated By: Jolaine Click, M.D.     1. Hip pain, acute, left   2. Lumbar radicular pain       MDM    Patient has ttp of the left lumbar paraspinal muscles and SI joint. Negative hip film.  Sx's likely related to sciatica.   No focal neuro  deficits on exam.  Ambulates with a steady gait.   Doubt emergent neurological or infectious process.  Pt given referral info and agrees to rest, ice and f/u         Hiram Mciver L. Trisha Mangle, PA-C 09/01/12 2130

## 2012-09-01 NOTE — ED Notes (Signed)
Pain lt hip, groin  And down ant thigh for 4 days, no known injury. Hx of back pain in past,,"but this feels different."

## 2012-09-01 NOTE — ED Notes (Signed)
Pain from left anterior hip area down to knee and sometimes to Left lower back. otc meds taken with no relief. Hx of sciatica. Rest helps. Denies injury.

## 2012-09-02 NOTE — ED Provider Notes (Signed)
Medical screening examination/treatment/procedure(s) were performed by non-physician practitioner and as supervising physician I was immediately available for consultation/collaboration.   Joya Gaskins, MD 09/02/12 [phone]

## 2012-10-26 ENCOUNTER — Emergency Department (HOSPITAL_COMMUNITY)
Admission: EM | Admit: 2012-10-26 | Discharge: 2012-10-26 | Disposition: A | Discharge status: Home / Self Care | Payer: Self-pay | Attending: Emergency Medicine | Admitting: Emergency Medicine

## 2012-10-26 ENCOUNTER — Encounter (HOSPITAL_COMMUNITY): Payer: Self-pay | Admitting: *Deleted

## 2012-10-26 DIAGNOSIS — Z8719 Personal history of other diseases of the digestive system: Secondary | ICD-10-CM | POA: Insufficient documentation

## 2012-10-26 DIAGNOSIS — G8929 Other chronic pain: Secondary | ICD-10-CM | POA: Insufficient documentation

## 2012-10-26 DIAGNOSIS — R52 Pain, unspecified: Secondary | ICD-10-CM | POA: Insufficient documentation

## 2012-10-26 DIAGNOSIS — Z8739 Personal history of other diseases of the musculoskeletal system and connective tissue: Secondary | ICD-10-CM | POA: Insufficient documentation

## 2012-10-26 DIAGNOSIS — Z880 Allergy status to penicillin: Secondary | ICD-10-CM | POA: Insufficient documentation

## 2012-10-26 DIAGNOSIS — M5430 Sciatica, unspecified side: Secondary | ICD-10-CM | POA: Insufficient documentation

## 2012-10-26 DIAGNOSIS — F17200 Nicotine dependence, unspecified, uncomplicated: Secondary | ICD-10-CM | POA: Insufficient documentation

## 2012-10-26 DIAGNOSIS — M5431 Sciatica, right side: Secondary | ICD-10-CM

## 2012-10-26 DIAGNOSIS — Z79899 Other long term (current) drug therapy: Secondary | ICD-10-CM | POA: Insufficient documentation

## 2012-10-26 DIAGNOSIS — Z791 Long term (current) use of non-steroidal anti-inflammatories (NSAID): Secondary | ICD-10-CM | POA: Insufficient documentation

## 2012-10-26 MED ORDER — IBUPROFEN 600 MG PO TABS: 600.0000 mg | ORAL_TABLET | Freq: Four times a day (QID) | ORAL | Status: DC | PRN

## 2012-10-26 MED ORDER — HYDROMORPHONE HCL PF 1 MG/ML IJ SOLN
1.0000 mg | Freq: Once | INTRAMUSCULAR | Status: AC
  Administered 2012-10-26: 1 mg via INTRAMUSCULAR
  Filled 2012-10-26: qty 1

## 2012-10-26 MED ORDER — HYDROCODONE-ACETAMINOPHEN 5-325 MG PO TABS: 1.0000 | ORAL_TABLET | ORAL | Status: DC | PRN

## 2012-10-26 MED ORDER — CYCLOBENZAPRINE HCL 5 MG PO TABS: 5.0000 mg | ORAL_TABLET | Freq: Three times a day (TID) | ORAL | Status: DC | PRN

## 2012-10-26 NOTE — ED Provider Notes (Signed)
History    CSN: [id number] Arrival date & time 10/26/12  4098  First MD Initiated Contact with Patient 10/26/12 [phone]     Chief Complaint  Patient presents with  . Back Pain   (Consider location/radiation/quality/duration/timing/severity/associated sxs/prior Treatment) HPI Comments: Lisa Mcclain is a 42 y.o. Female presenting with acute on chronic low back pain which has which has been present for the past day.   Patient denies any new injury specifically, but was working in her yard yesterday and afterward,  Developed gradual onset of pain with radiation down her right leg to her knee.   There has been no weakness or numbness in the lower extremities and no urinary or bowel retention or incontinence.  Patient does not have a history of cancer or IVDU.  She has used ibuprofen 400 mg without relief.      The history is provided by the patient and the spouse.   Past Medical History  Diagnosis Date  . Celiac disease   . Back pain   . Tachycardia   . Nerve compression   . Arthritis   . Degeneration of cervical intervertebral disc   . Sciatica    Past Surgical History  Procedure Laterality Date  . I&d extremity  04/20/2012    Procedure: IRRIGATION AND DEBRIDEMENT EXTREMITY;  Surgeon: Marlowe Shores, MD;  Location: [facility];  Service: Orthopedics;  Laterality: Left;   Family History  Problem Relation Age of Onset  . Cancer Other    History  Substance Use Topics  . Smoking status: Current Every Day Smoker -- 1.00 packs/day for 28 years    Types: Cigarettes  . Smokeless tobacco: Never Used  . Alcohol Use: No   OB History   Grav Para Term Preterm Abortions TAB SAB Ect Mult Living   3 2   1  1   2      Review of Systems  Constitutional: Negative for fever.  Respiratory: Negative for shortness of breath.   Cardiovascular: Negative for chest pain and leg swelling.  Gastrointestinal: Negative for abdominal pain, constipation and abdominal distention.   Genitourinary: Negative for dysuria, urgency, frequency, flank pain and difficulty urinating.  Musculoskeletal: Positive for back pain. Negative for joint swelling and gait problem.  Skin: Negative for rash.  Neurological: Negative for weakness and numbness.    Allergies  Cephalexin; Erythromycin; Gluten; Tramadol; Wheat; and Penicillins  Home Medications   Current Outpatient Rx  Name  Route  Sig  Dispense  Refill  . cyclobenzaprine (FLEXERIL) 10 MG tablet   Oral   Take 1 tablet (10 mg total) by mouth 3 (three) times daily as needed.   21 tablet   0   . cyclobenzaprine (FLEXERIL) 5 MG tablet   Oral   Take 1 tablet (5 mg total) by mouth 3 (three) times daily as needed for muscle spasms.   15 tablet   0   . HYDROcodone-acetaminophen (NORCO/VICODIN) 5-325 MG per tablet   Oral   Take 1 tablet by mouth every 4 (four) hours as needed for pain.   15 tablet   0   . ibuprofen (ADVIL,MOTRIN) 200 MG tablet   Oral   Take 400 mg by mouth every 6 (six) hours as needed for pain.         Marland Kitchen ibuprofen (ADVIL,MOTRIN) 600 MG tablet   Oral   Take 1 tablet (600 mg total) by mouth every 6 (six) hours as needed for pain.   20 tablet  0   . naproxen (NAPROSYN) 500 MG tablet   Oral   Take 1 tablet (500 mg total) by mouth 2 (two) times daily with a meal.   20 tablet   0   . oxyCODONE-acetaminophen (PERCOCET/ROXICET) 5-325 MG per tablet   Oral   Take 1 tablet by mouth every 4 (four) hours as needed for pain.   20 tablet   0    BP 135/78  Pulse 109  Temp(Src) 98 F (36.7 C) (Oral)  Resp 20  Ht 5\' 4"  (1.626 m)  Wt 155 lb (70.308 kg)  BMI 26.59 kg/m2  SpO2 100%  LMP 10/19/2012 Physical Exam  Nursing note and vitals reviewed. Constitutional: She appears well-developed and well-nourished.  HENT:  Head: Normocephalic.  Eyes: Conjunctivae are normal.  Neck: Normal range of motion. Neck supple.  Cardiovascular: Normal rate and intact distal pulses.   Pedal pulses normal.   Pulmonary/Chest: Effort normal.  Abdominal: Soft. Bowel sounds are normal. She exhibits no distension and no mass.  Musculoskeletal: Normal range of motion. She exhibits no edema.       Lumbar back: She exhibits tenderness. She exhibits no bony tenderness, no swelling, no edema and no spasm.  Right paralumbar ttp,  Right SI joint ttp.  Neurological: She is alert. She has normal strength. She displays no atrophy and no tremor. No sensory deficit. Gait normal.  Reflex Scores:      Patellar reflexes are 2+ on the right side and 2+ on the left side.      Achilles reflexes are 2+ on the right side and 2+ on the left side. No strength deficit noted in hip and knee flexor and extensor muscle groups.  Ankle flexion and extension intact.  Skin: Skin is warm and dry.  Psychiatric: She has a normal mood and affect.    ED Course  Procedures (including critical care time) Labs Reviewed - No data to display No results found. 1. Sciatica neuralgia, right     MDM  No neuro deficit on exam or by history to suggest emergent or surgical presentation.  Also discussed worsened sx that should prompt immediate re-evaluation including distal weakness, bowel/bladder retention/incontinence.  Pt was prescribed ibuprofen, flexeril,  Hydrocodone.  Encouraged ice, heat,  Rest.  Resources given for establishing pcp.        Burgess Amor, PA-C 10/26/12 [phone]

## 2012-10-26 NOTE — ED Notes (Signed)
C/o lower back pain radiating down right lower extremity onset yesterday after weeding in garden. Reports taking ibuprofen with no relief.

## 2012-10-26 NOTE — ED Provider Notes (Signed)
Medical screening examination/treatment/procedure(s) were performed by non-physician practitioner and as supervising physician I was immediately available for consultation/collaboration.   Shelda Jakes, MD 10/26/12 [phone]

## 2012-11-24 ENCOUNTER — Emergency Department (HOSPITAL_COMMUNITY)
Admission: EM | Admit: 2012-11-24 | Discharge: 2012-11-24 | Disposition: A | Discharge status: Home / Self Care | Payer: Self-pay | Attending: Emergency Medicine | Admitting: Emergency Medicine

## 2012-11-24 ENCOUNTER — Encounter (HOSPITAL_COMMUNITY): Payer: Self-pay | Admitting: Emergency Medicine

## 2012-11-24 DIAGNOSIS — Z880 Allergy status to penicillin: Secondary | ICD-10-CM | POA: Insufficient documentation

## 2012-11-24 DIAGNOSIS — Z791 Long term (current) use of non-steroidal anti-inflammatories (NSAID): Secondary | ICD-10-CM | POA: Insufficient documentation

## 2012-11-24 DIAGNOSIS — M719 Bursopathy, unspecified: Secondary | ICD-10-CM | POA: Insufficient documentation

## 2012-11-24 DIAGNOSIS — F17200 Nicotine dependence, unspecified, uncomplicated: Secondary | ICD-10-CM | POA: Insufficient documentation

## 2012-11-24 DIAGNOSIS — X58XXXA Exposure to other specified factors, initial encounter: Secondary | ICD-10-CM | POA: Insufficient documentation

## 2012-11-24 DIAGNOSIS — Z91040 Latex allergy status: Secondary | ICD-10-CM | POA: Insufficient documentation

## 2012-11-24 DIAGNOSIS — M67919 Unspecified disorder of synovium and tendon, unspecified shoulder: Secondary | ICD-10-CM | POA: Insufficient documentation

## 2012-11-24 DIAGNOSIS — S46912A Strain of unspecified muscle, fascia and tendon at shoulder and upper arm level, left arm, initial encounter: Secondary | ICD-10-CM

## 2012-11-24 DIAGNOSIS — M129 Arthropathy, unspecified: Secondary | ICD-10-CM | POA: Insufficient documentation

## 2012-11-24 DIAGNOSIS — Y92009 Unspecified place in unspecified non-institutional (private) residence as the place of occurrence of the external cause: Secondary | ICD-10-CM | POA: Insufficient documentation

## 2012-11-24 DIAGNOSIS — IMO0002 Reserved for concepts with insufficient information to code with codable children: Secondary | ICD-10-CM | POA: Insufficient documentation

## 2012-11-24 DIAGNOSIS — Z8739 Personal history of other diseases of the musculoskeletal system and connective tissue: Secondary | ICD-10-CM | POA: Insufficient documentation

## 2012-11-24 DIAGNOSIS — Z8679 Personal history of other diseases of the circulatory system: Secondary | ICD-10-CM | POA: Insufficient documentation

## 2012-11-24 DIAGNOSIS — Y9389 Activity, other specified: Secondary | ICD-10-CM | POA: Insufficient documentation

## 2012-11-24 DIAGNOSIS — Z8719 Personal history of other diseases of the digestive system: Secondary | ICD-10-CM | POA: Insufficient documentation

## 2012-11-24 MED ORDER — ONDANSETRON HCL 4 MG PO TABS
4.0000 mg | ORAL_TABLET | Freq: Once | ORAL | Status: AC
  Administered 2012-11-24: 4 mg via ORAL
  Filled 2012-11-24: qty 1

## 2012-11-24 MED ORDER — DICLOFENAC SODIUM 75 MG PO TBEC: 75.0000 mg | DELAYED_RELEASE_TABLET | Freq: Two times a day (BID) | ORAL | Status: DC

## 2012-11-24 MED ORDER — KETOROLAC TROMETHAMINE 10 MG PO TABS
10.0000 mg | ORAL_TABLET | Freq: Once | ORAL | Status: AC
  Administered 2012-11-24: 10 mg via ORAL
  Filled 2012-11-24: qty 1

## 2012-11-24 MED ORDER — HYDROCODONE-ACETAMINOPHEN 5-325 MG PO TABS
2.0000 | ORAL_TABLET | Freq: Once | ORAL | Status: AC
  Administered 2012-11-24: 2 via ORAL
  Filled 2012-11-24: qty 2

## 2012-11-24 MED ORDER — HYDROCODONE-ACETAMINOPHEN 5-325 MG PO TABS: 1.0000 | ORAL_TABLET | ORAL | Status: DC | PRN

## 2012-11-24 NOTE — ED Notes (Signed)
Pt c/o chronic left shoulder pain due to " a tear". Did yard work yesterday and pain started yesterday and worse today with radiation to elbow. rom limited due to pain. Pt guarding arm. Ibuprofen with little relief.

## 2012-11-24 NOTE — ED Provider Notes (Addendum)
CSN: [id number]     Arrival date & time 11/24/12  1336 History     First MD Initiated Contact with Patient 11/24/12 1429     Chief Complaint  Patient presents with  . Shoulder Pain   (Consider location/radiation/quality/duration/timing/severity/associated sxs/prior Treatment) HPI Comments: Patient presents to the emergency department with left shoulder pain. The patient states she has had problems with her left shoulder in the past related to rotator cuff injury. On yesterday the patient states that she was finishing yard work when the chronic pain form the rotator cuff injury became worse, and began to radiate to the elbow. She has tired ibuprofen with little or no relief. No fall or other injury to the left shoulder. The history is provided by the patient.    Past Medical History  Diagnosis Date  . Celiac disease   . Back pain   . Tachycardia   . Nerve compression   . Arthritis   . Degeneration of cervical intervertebral disc   . Sciatica    Past Surgical History  Procedure Laterality Date  . I&d extremity  04/20/2012    Procedure: IRRIGATION AND DEBRIDEMENT EXTREMITY;  Surgeon: Marlowe Shores, MD;  Location: [facility];  Service: Orthopedics;  Laterality: Left;   Family History  Problem Relation Age of Onset  . Cancer Other    History  Substance Use Topics  . Smoking status: Current Every Day Smoker -- 1.00 packs/day for 28 years    Types: Cigarettes  . Smokeless tobacco: Never Used  . Alcohol Use: No   OB History   Grav Para Term Preterm Abortions TAB SAB Ect Mult Living   3 2   1  1   2      Review of Systems  Constitutional: Negative for activity change.       All ROS Neg except as noted in HPI  HENT: Positive for neck pain. Negative for nosebleeds.   Eyes: Negative for photophobia and discharge.  Respiratory: Negative for cough, shortness of breath and wheezing.   Cardiovascular: Negative for chest pain and palpitations.  Gastrointestinal:  Negative for abdominal pain and blood in stool.  Genitourinary: Negative for dysuria, frequency and hematuria.  Musculoskeletal: Positive for back pain and arthralgias.  Skin: Negative.   Neurological: Negative for dizziness, seizures and speech difficulty.  Psychiatric/Behavioral: Negative for hallucinations and confusion.    Allergies  Cephalexin; Erythromycin; Gluten; Latex; Tramadol; Wheat; and Penicillins  Home Medications   Current Outpatient Rx  Name  Route  Sig  Dispense  Refill  . ibuprofen (ADVIL,MOTRIN) 200 MG tablet   Oral   Take 400 mg by mouth every 6 (six) hours as needed for pain.         Marland Kitchen diclofenac (VOLTAREN) 75 MG EC tablet   Oral   Take 1 tablet (75 mg total) by mouth 2 (two) times daily.   12 tablet   0   . HYDROcodone-acetaminophen (NORCO/VICODIN) 5-325 MG per tablet   Oral   Take 1 tablet by mouth every 4 (four) hours as needed for pain.   15 tablet   0    BP 140/85  Pulse 98  Temp(Src) 98.9 F (37.2 C) (Oral)  Resp 19  SpO2 100%  LMP 11/10/2012 Physical Exam  Nursing note and vitals reviewed. Constitutional: She is oriented to person, place, and time. She appears well-developed and well-nourished.  Non-toxic appearance.  HENT:  Head: Normocephalic.  Right Ear: Tympanic membrane and external ear normal.  Left  Ear: Tympanic membrane and external ear normal.  Eyes: EOM and lids are normal. Pupils are equal, round, and reactive to light.  Neck: Normal range of motion. Neck supple. Carotid bruit is not present.  Cardiovascular: Normal rate, regular rhythm, normal heart sounds, intact distal pulses and normal pulses.   Pulmonary/Chest: Breath sounds normal. No respiratory distress.  Abdominal: Soft. Bowel sounds are normal. There is no tenderness. There is no guarding.  Musculoskeletal:       Left shoulder: She exhibits decreased range of motion, tenderness and pain.       Arms: Lymphadenopathy:       Head (right side): No submandibular  adenopathy present.       Head (left side): No submandibular adenopathy present.    She has no cervical adenopathy.  Neurological: She is alert and oriented to person, place, and time. She has normal strength. No cranial nerve deficit or sensory deficit.  Skin: Skin is warm and dry.  Psychiatric: She has a normal mood and affect. Her speech is normal.    ED Course   Procedures (including critical care time)  Labs Reviewed - No data to display No results found. 1. Shoulder strain, left, initial encounter   2. Tendonitis of shoulder, left     MDM  *I have reviewed nursing notes, vital signs, and all appropriate lab and imaging results for this patient.** Pt to see Dr Romeo Apple for additional evaluation. Rx for diclofenac and norco given to the patient.Pt fitted with a sling. No neuo-vascular deficit noted on today's exam.  Kathie Dike, PA-C 11/24/12 1602  Kathie Dike, PA-C 11/29/12 1327  Kathie Dike, PA-C 12/01/12 [phone]

## 2012-11-24 NOTE — Progress Notes (Signed)
ED/CM noted patient did not have health insurance and/or PCP listed in the computer.  Patient was given the [place] resource handout with information on the clinics, food pantries, and the handout for new health insurance sign-up.  Patient expressed appreciation for this. 

## 2012-11-25 NOTE — ED Provider Notes (Signed)
Medical screening examination/treatment/procedure(s) were performed by non-physician practitioner and as supervising physician I was immediately available for consultation/collaboration.    Genesi Stefanko R Jalaiya Oyster, MD 11/25/12 1407 

## 2012-11-30 NOTE — ED Provider Notes (Signed)
Medical screening examination/treatment/procedure(s) were performed by non-physician practitioner and as supervising physician I was immediately available for consultation/collaboration.    Kainoa Swoboda R Yamili Lichtenwalner, MD 11/30/12 0926 

## 2012-12-06 NOTE — ED Provider Notes (Signed)
Medical screening examination/treatment/procedure(s) were performed by non-physician practitioner and as supervising physician I was immediately available for consultation/collaboration.   Celene Kras, MD 12/06/12 [phone]

## 2013-01-11 ENCOUNTER — Emergency Department (HOSPITAL_COMMUNITY)
Admission: EM | Admit: 2013-01-11 | Discharge: 2013-01-11 | Disposition: A | Discharge status: Home / Self Care | Payer: Self-pay | Attending: Emergency Medicine | Admitting: Emergency Medicine

## 2013-01-11 ENCOUNTER — Emergency Department (HOSPITAL_COMMUNITY): Payer: Self-pay

## 2013-01-11 ENCOUNTER — Encounter (HOSPITAL_COMMUNITY): Payer: Self-pay | Admitting: Emergency Medicine

## 2013-01-11 DIAGNOSIS — Z8719 Personal history of other diseases of the digestive system: Secondary | ICD-10-CM | POA: Insufficient documentation

## 2013-01-11 DIAGNOSIS — Y929 Unspecified place or not applicable: Secondary | ICD-10-CM | POA: Insufficient documentation

## 2013-01-11 DIAGNOSIS — X58XXXA Exposure to other specified factors, initial encounter: Secondary | ICD-10-CM | POA: Insufficient documentation

## 2013-01-11 DIAGNOSIS — Z79899 Other long term (current) drug therapy: Secondary | ICD-10-CM | POA: Insufficient documentation

## 2013-01-11 DIAGNOSIS — Z8739 Personal history of other diseases of the musculoskeletal system and connective tissue: Secondary | ICD-10-CM | POA: Insufficient documentation

## 2013-01-11 DIAGNOSIS — Y9301 Activity, walking, marching and hiking: Secondary | ICD-10-CM | POA: Insufficient documentation

## 2013-01-11 DIAGNOSIS — Z880 Allergy status to penicillin: Secondary | ICD-10-CM | POA: Insufficient documentation

## 2013-01-11 DIAGNOSIS — Z8669 Personal history of other diseases of the nervous system and sense organs: Secondary | ICD-10-CM | POA: Insufficient documentation

## 2013-01-11 DIAGNOSIS — F17200 Nicotine dependence, unspecified, uncomplicated: Secondary | ICD-10-CM | POA: Insufficient documentation

## 2013-01-11 DIAGNOSIS — Z91040 Latex allergy status: Secondary | ICD-10-CM | POA: Insufficient documentation

## 2013-01-11 DIAGNOSIS — S92253A Displaced fracture of navicular [scaphoid] of unspecified foot, initial encounter for closed fracture: Secondary | ICD-10-CM | POA: Insufficient documentation

## 2013-01-11 DIAGNOSIS — S92252A Displaced fracture of navicular [scaphoid] of left foot, initial encounter for closed fracture: Secondary | ICD-10-CM

## 2013-01-11 MED ORDER — IBUPROFEN 800 MG PO TABS: 800.0000 mg | ORAL_TABLET | Freq: Three times a day (TID) | ORAL | Status: DC

## 2013-01-11 MED ORDER — HYDROCODONE-ACETAMINOPHEN 5-325 MG PO TABS: ORAL_TABLET | ORAL | Status: DC

## 2013-01-11 MED ORDER — HYDROCODONE-ACETAMINOPHEN 5-325 MG PO TABS
1.0000 | ORAL_TABLET | Freq: Once | ORAL | Status: AC
  Administered 2013-01-11: 1 via ORAL
  Filled 2013-01-11: qty 1

## 2013-01-11 MED ORDER — IBUPROFEN 800 MG PO TABS
800.0000 mg | ORAL_TABLET | Freq: Once | ORAL | Status: AC
  Administered 2013-01-11: 800 mg via ORAL
  Filled 2013-01-11: qty 1

## 2013-01-11 NOTE — ED Notes (Signed)
Pt seen and evaluated by EDPa for initial assessment. 

## 2013-01-11 NOTE — ED Provider Notes (Signed)
CSN: [id number]     Arrival date & time 01/11/13  1833 History  This chart was scribed for non-physician practitioner Pauline Aus, PA-C working with Laray Anger, DO by Danella Maiers, ED Scribe. This patient was seen in room APFT20/APFT20 and the patient's care was started at 6:41 PM.   Chief Complaint  Patient presents with  . Foot Pain   Patient is a 42 y.o. female presenting with lower extremity pain. The history is provided by the patient. No language interpreter was used.  Foot Pain The current episode started more than 1 week ago. The problem occurs daily. The problem has not changed since onset.Pertinent negatives include no shortness of breath. The symptoms are aggravated by walking and bending. The symptoms are relieved by ice and NSAIDs.  Foot Pain The current episode started more than 1 week ago. The problem occurs daily. The problem has not changed since onset.Associated symptoms include arthralgias (left foot). Pertinent negatives include no chills, fever, myalgias, numbness, rash or weakness. The symptoms are aggravated by walking and bending.   HPI Comments: Lisa Mcclain is a 42 y.o. female who presents to the Emergency Department complaining of left dorsal foot pain with associated mild swelling onset week ago after hiking 14 miles. Pt reports she was mildly sore after the hike but noticed the soreness worsening over the next few days. She tried hiking again today but states that she was unable to walk due to the pain. She also reports intermittent shooting pains up her left leg. She broke the navicular bone in the same foot three years ago and notes it feels like the same type of pain. The pain is worsened by walking and flexing her toes. She notes she was wearing good shoes while hiking. She has tried icing the area and taking ibuprofen but it has not improved the pain.  Past Medical History  Diagnosis Date  . Celiac disease   . Back pain   . Tachycardia   . Nerve  compression   . Arthritis   . Degeneration of cervical intervertebral disc   . Sciatica    Past Surgical History  Procedure Laterality Date  . I&d extremity  04/20/2012    Procedure: IRRIGATION AND DEBRIDEMENT EXTREMITY;  Surgeon: Marlowe Shores, MD;  Location: [facility];  Service: Orthopedics;  Laterality: Left;   Family History  Problem Relation Age of Onset  . Cancer Other    History  Substance Use Topics  . Smoking status: Current Every Day Smoker -- 1.00 packs/day for 28 years    Types: Cigarettes  . Smokeless tobacco: Never Used  . Alcohol Use: No   OB History   Grav Para Term Preterm Abortions TAB SAB Ect Mult Living   3 2   1  1   2      Review of Systems  Constitutional: Negative for fever and chills.  Respiratory: Negative for shortness of breath.   Musculoskeletal: Positive for arthralgias (left foot). Negative for back pain and myalgias.  Skin: Negative for rash.  Neurological: Negative for dizziness, weakness and numbness.  Hematological: Does not bruise/bleed easily.  All other systems reviewed and are negative.    Allergies  Cephalexin; Erythromycin; Gluten; Latex; Tramadol; Wheat; and Penicillins  Home Medications   Current Outpatient Rx  Name  Route  Sig  Dispense  Refill  . diclofenac (VOLTAREN) 75 MG EC tablet   Oral   Take 1 tablet (75 mg total) by mouth 2 (two) times  daily.   12 tablet   0   . HYDROcodone-acetaminophen (NORCO/VICODIN) 5-325 MG per tablet   Oral   Take 1 tablet by mouth every 4 (four) hours as needed for pain.   15 tablet   0   . ibuprofen (ADVIL,MOTRIN) 200 MG tablet   Oral   Take 400 mg by mouth every 6 (six) hours as needed for pain.          BP 111/63  Pulse 99  Temp(Src) 99.1 F (37.3 C) (Oral)  Resp 16  Ht 5\' 5"  (1.651 m)  Wt 150 lb (68.04 kg)  BMI 24.96 kg/m2  SpO2 98%  LMP 01/09/2013 Physical Exam  Nursing note and vitals reviewed. Constitutional: She is oriented to person, place,  and time. She appears well-developed and well-nourished. No distress.  HENT:  Head: Normocephalic and atraumatic.  Cardiovascular: Normal rate, regular rhythm, normal heart sounds and intact distal pulses.   Pulmonary/Chest: Effort normal and breath sounds normal.  Musculoskeletal: She exhibits tenderness. She exhibits no edema.  Lateral left ankle and dorsal left foot are ttp.  ROM is preserved.  DP pulse is brisk,distal sensation intact.  No erythema or bony deformity.  No proximal tenderness. No edema.  Neurological: She is alert and oriented to person, place, and time. She exhibits normal muscle tone. Coordination normal.  Skin: Skin is warm and dry.    ED Course  Procedures (including critical care time) Medications - No data to display  DIAGNOSTIC STUDIES: Oxygen Saturation is 98% on RA, normal by my interpretation.    COORDINATION OF CARE: 6:52 PM- Discussed treatment plan with pt which includes foot x-ray and pt agrees to plan.  7:36 PM- Consulted with Dr. Hilda Lias who said to put pt in a Cam walker and he will see her in his office Monday for follow-up.  8:10 PM- Rechecked with pt concerning f/u with Dr. Hilda Lias and pt agrees to plan.    Labs Review Labs Reviewed - No data to display Imaging Review Dg Foot Complete Left  01/11/2013   CLINICAL DATA:  Foot pain  EXAM: LEFT FOOT - COMPLETE 3+ VIEW  COMPARISON:  None.  FINDINGS: There is lucency through the dorsal aspect of the navicular worrisome for a nondisplaced fracture. This involves a small sub cm bone fragment which may represent spurring. Bony framework is otherwise intact. Soft tissues are unremarkable.  IMPRESSION: Nondisplaced fracture of the navicular as described above.   Electronically Signed   By: Maryclare Bean M.D.   On: 01/11/2013 19:29    MDM    Cam walker applied, pain improved.  Remains NV intact.    Patient is feeling better and requests d/c, agrees to f/u .  appears stable for discharge.     I  personally performed the services described in this documentation, which was scribed in my presence. The recorded information has been reviewed and is accurate.     Aireanna Luellen L. Trisha Mangle, PA-C 01/12/13 4540

## 2013-01-11 NOTE — ED Notes (Signed)
Increased foot pain, worse with walking x 1 wk.  Denies injury.

## 2013-01-12 NOTE — ED Provider Notes (Signed)
Medical screening examination/treatment/procedure(s) were performed by non-physician practitioner and as supervising physician I was immediately available for consultation/collaboration.   Brentin Shin M Denissa Cozart, DO 01/12/13 1735 

## 2013-02-25 ENCOUNTER — Emergency Department (HOSPITAL_COMMUNITY)
Admission: EM | Admit: 2013-02-25 | Discharge: 2013-02-25 | Disposition: A | Discharge status: Home / Self Care | Payer: Self-pay | Attending: Emergency Medicine | Admitting: Emergency Medicine

## 2013-02-25 ENCOUNTER — Encounter (HOSPITAL_COMMUNITY): Payer: Self-pay | Admitting: Emergency Medicine

## 2013-02-25 DIAGNOSIS — W268XXA Contact with other sharp object(s), not elsewhere classified, initial encounter: Secondary | ICD-10-CM | POA: Insufficient documentation

## 2013-02-25 DIAGNOSIS — F17200 Nicotine dependence, unspecified, uncomplicated: Secondary | ICD-10-CM | POA: Insufficient documentation

## 2013-02-25 DIAGNOSIS — Z8719 Personal history of other diseases of the digestive system: Secondary | ICD-10-CM | POA: Insufficient documentation

## 2013-02-25 DIAGNOSIS — M5030 Other cervical disc degeneration, unspecified cervical region: Secondary | ICD-10-CM | POA: Insufficient documentation

## 2013-02-25 DIAGNOSIS — Z880 Allergy status to penicillin: Secondary | ICD-10-CM | POA: Insufficient documentation

## 2013-02-25 DIAGNOSIS — R110 Nausea: Secondary | ICD-10-CM | POA: Insufficient documentation

## 2013-02-25 DIAGNOSIS — L089 Local infection of the skin and subcutaneous tissue, unspecified: Secondary | ICD-10-CM

## 2013-02-25 DIAGNOSIS — M129 Arthropathy, unspecified: Secondary | ICD-10-CM | POA: Insufficient documentation

## 2013-02-25 DIAGNOSIS — Y9389 Activity, other specified: Secondary | ICD-10-CM | POA: Insufficient documentation

## 2013-02-25 DIAGNOSIS — S61209A Unspecified open wound of unspecified finger without damage to nail, initial encounter: Secondary | ICD-10-CM | POA: Insufficient documentation

## 2013-02-25 DIAGNOSIS — Z8614 Personal history of Methicillin resistant Staphylococcus aureus infection: Secondary | ICD-10-CM | POA: Insufficient documentation

## 2013-02-25 DIAGNOSIS — Z792 Long term (current) use of antibiotics: Secondary | ICD-10-CM | POA: Insufficient documentation

## 2013-02-25 DIAGNOSIS — M549 Dorsalgia, unspecified: Secondary | ICD-10-CM | POA: Insufficient documentation

## 2013-02-25 DIAGNOSIS — Z91040 Latex allergy status: Secondary | ICD-10-CM | POA: Insufficient documentation

## 2013-02-25 DIAGNOSIS — Y9289 Other specified places as the place of occurrence of the external cause: Secondary | ICD-10-CM | POA: Insufficient documentation

## 2013-02-25 MED ORDER — IBUPROFEN 800 MG PO TABS
800.0000 mg | ORAL_TABLET | Freq: Once | ORAL | Status: AC
  Administered 2013-02-25: 800 mg via ORAL
  Filled 2013-02-25: qty 1

## 2013-02-25 MED ORDER — DOXYCYCLINE HYCLATE 100 MG PO TABS
100.0000 mg | ORAL_TABLET | Freq: Once | ORAL | Status: AC
  Administered 2013-02-25: 100 mg via ORAL
  Filled 2013-02-25: qty 1

## 2013-02-25 MED ORDER — CEFTRIAXONE SODIUM 1 G IJ SOLR
1.0000 g | Freq: Once | INTRAMUSCULAR | Status: AC
  Administered 2013-02-25: 1 g via INTRAMUSCULAR
  Filled 2013-02-25: qty 10

## 2013-02-25 MED ORDER — LIDOCAINE HCL (PF) 1 % IJ SOLN
INTRAMUSCULAR | Status: AC
  Administered 2013-02-25: 2.1 mL
  Filled 2013-02-25: qty 5

## 2013-02-25 MED ORDER — HYDROCODONE-ACETAMINOPHEN 5-325 MG PO TABS: 1.0000 | ORAL_TABLET | ORAL | Status: DC | PRN

## 2013-02-25 MED ORDER — SULFAMETHOXAZOLE-TRIMETHOPRIM 800-160 MG PO TABS: 1.0000 | ORAL_TABLET | Freq: Two times a day (BID) | ORAL | Status: DC

## 2013-02-25 MED ORDER — HYDROCODONE-ACETAMINOPHEN 5-325 MG PO TABS
1.0000 | ORAL_TABLET | Freq: Once | ORAL | Status: AC
  Administered 2013-02-25: 1 via ORAL
  Filled 2013-02-25: qty 1

## 2013-02-25 MED ORDER — PROMETHAZINE HCL 12.5 MG PO TABS
12.5000 mg | ORAL_TABLET | Freq: Once | ORAL | Status: AC
  Administered 2013-02-25: 12.5 mg via ORAL
  Filled 2013-02-25: qty 1

## 2013-02-25 NOTE — ED Notes (Signed)
Pt with small lac to right index finger while working with bamboo yesterday, swelling started last night and has increased today, pt states that she not feeling well with mild nausea, denies fever or emesis

## 2013-02-25 NOTE — ED Notes (Signed)
Pt seen and evaluated by EDPa for initial assessment. 

## 2013-02-25 NOTE — ED Provider Notes (Signed)
CSN: [id number]     Arrival date & time 02/25/13  1750 History   First MD Initiated Contact with Patient 02/25/13 1813     Chief Complaint  Patient presents with  . Finger Injury   (Consider location/radiation/quality/duration/timing/severity/associated sxs/prior Treatment) HPI Comments: Patient is a 42 year old female who states that on yesterday November 21 she was working with sandpaper and bamboo doing a craft when she sustained a laceration to the right index finger. The patient states that she cleaned it out the beginning to notice pain and swelling at approximately 2:00 this morning. The pain and swelling his been getting progressively worse throughout the day. The patient has not measured any temperature elevation at home. There's been mild nausea, but no actual vomiting. The patient has soreness when moving the finger, but is able to move all the fingers of the right hand. She has tried ibuprofen for soreness but this has not resolved her problem. 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  The history is provided by the patient.    Past Medical History  Diagnosis Date  . Celiac disease   . Back pain   . Tachycardia   . Nerve compression   . Arthritis   . Degeneration of cervical intervertebral disc   . Sciatica    Past Surgical History  Procedure Laterality Date  . I&d extremity  04/20/2012    Procedure: IRRIGATION AND DEBRIDEMENT EXTREMITY;  Surgeon: Marlowe Shores, MD;  Location: [facility];  Service: Orthopedics;  Laterality: Left;   Family History  Problem Relation Age of Onset  . Cancer Other    History  Substance Use Topics  . Smoking status: Current Every Day Smoker -- 1.00 packs/day for 28 years    Types: Cigarettes  . Smokeless tobacco: Never Used  . Alcohol Use: Yes     Comment: occasional use   OB History   Grav Para Term  Preterm Abortions TAB SAB Ect Mult Living   3 2   1  1   2      Review of Systems  Constitutional: Negative for activity change.       All ROS Neg except as noted in HPI  HENT: Negative for nosebleeds.   Eyes: Negative for photophobia and discharge.  Respiratory: Negative for cough, shortness of breath and wheezing.   Cardiovascular: Negative for chest pain and palpitations.  Gastrointestinal: Negative for abdominal pain and blood in stool.  Genitourinary: Negative for dysuria, frequency and hematuria.  Musculoskeletal: Positive for arthralgias and back pain. Negative for neck pain.  Skin: Negative.   Neurological: Negative for dizziness, seizures and speech difficulty.  Psychiatric/Behavioral: Negative for hallucinations and confusion.    Allergies  Cephalexin; Erythromycin; Gluten; Latex; Tramadol; Wheat; and Penicillins  Home Medications   Current Outpatient Rx  Name  Route  Sig  Dispense  Refill  . ibuprofen (ADVIL,MOTRIN) 200 MG tablet   Oral   Take 400 mg by mouth every 6 (six) hours as needed for pain.         Marland Kitchen HYDROcodone-acetaminophen (NORCO) 5-325 MG per tablet   Oral   Take 1 tablet by mouth every 4 (four) hours as needed for moderate pain.   15 tablet   0   . sulfamethoxazole-trimethoprim (SEPTRA DS) 800-160 MG per tablet   Oral   Take 1 tablet by mouth 2 (two) times daily.  14 tablet   0    BP 141/99  Pulse 108  Temp(Src) 98.1 F (36.7 C) (Oral)  Resp 20  Ht 5\' 4"  (1.626 m)  Wt 160 lb (72.576 kg)  BMI 27.45 kg/m2  SpO2 100%  LMP 02/04/2013 Physical Exam  Nursing note and vitals reviewed. Constitutional: She is oriented to person, place, and time. She appears well-developed and well-nourished.  Non-toxic appearance.  HENT:  Head: Normocephalic.  Right Ear: Tympanic membrane and external ear normal.  Left Ear: Tympanic membrane and external ear normal.  Eyes: EOM and lids are normal. Pupils are equal, round, and reactive to light.  Neck:  Normal range of motion. Neck supple. Carotid bruit is not present.  Cardiovascular: Normal rate, regular rhythm, normal heart sounds, intact distal pulses and normal pulses.   Pulmonary/Chest: Breath sounds normal. No respiratory distress.  Abdominal: Soft. Bowel sounds are normal. There is no tenderness. There is no guarding.  Musculoskeletal: Normal range of motion.  There is full range of motion of the right elbow and shoulder. There is no enlargement of the lymph nodes of the bicep tricep area. There is good pronation and supination involving the forearm. There is full range of motion of the right wrist. There is soreness with movement of the right index finger. There is 2 abrasions/laceration areas involving the index finger. There is mild swelling involving the index finger. There is no red streaking going up the arm. The capillary refill is less than 2 seconds.  Lymphadenopathy:       Head (right side): No submandibular adenopathy present.       Head (left side): No submandibular adenopathy present.    She has no cervical adenopathy.  Neurological: She is alert and oriented to person, place, and time. She has normal strength. No cranial nerve deficit or sensory deficit.  Skin: Skin is warm and dry.  Psychiatric: She has a normal mood and affect. Her speech is normal.    ED Course  Procedures (including critical care time) Labs Review Labs Reviewed - No data to display Imaging Review No results found.  EKG Interpretation   None       MDM   1. Infected finger laceration, initial encounter    *I have reviewed nursing notes, vital signs, and all appropriate lab and imaging results for this patient.**  Is no drainage or discharge from the lacerations to the index finger. The finger is mildly swollen and very tender to palpation. The area is not hot. The patient has a tachycardia of 108. The temperature is normal at 98.1.  The wound was cleansed and sterile bandage applied. The  patient's tetanus is up-to-date. The patient was treated in the emergency department with intramuscular Rocephin and doxycycline. Prescription for Septra and Norco were given to the patient. The patient is to followup at the adult and pediatric clinic here in town in 3-4 days, she is to return here sooner if any emergent changes involving the finger.  Kathie Dike, PA-C 02/25/13 1925

## 2013-02-26 NOTE — ED Provider Notes (Signed)
Medical screening examination/treatment/procedure(s) were performed by non-physician practitioner and as supervising physician I was immediately available for consultation/collaboration.  EKG Interpretation   None       Devoria Albe, MD, Armando Gang   Ward Givens, MD 02/26/13 [phone]

## 2013-05-26 ENCOUNTER — Emergency Department (HOSPITAL_COMMUNITY)
Admission: EM | Admit: 2013-05-26 | Discharge: 2013-05-26 | Disposition: A | Discharge status: Home / Self Care | Payer: Self-pay | Attending: Emergency Medicine | Admitting: Emergency Medicine

## 2013-05-26 ENCOUNTER — Encounter (HOSPITAL_COMMUNITY): Payer: Self-pay | Admitting: Emergency Medicine

## 2013-05-26 DIAGNOSIS — Z91040 Latex allergy status: Secondary | ICD-10-CM | POA: Insufficient documentation

## 2013-05-26 DIAGNOSIS — Z8719 Personal history of other diseases of the digestive system: Secondary | ICD-10-CM | POA: Insufficient documentation

## 2013-05-26 DIAGNOSIS — Z880 Allergy status to penicillin: Secondary | ICD-10-CM | POA: Insufficient documentation

## 2013-05-26 DIAGNOSIS — M545 Low back pain, unspecified: Secondary | ICD-10-CM

## 2013-05-26 DIAGNOSIS — Z79899 Other long term (current) drug therapy: Secondary | ICD-10-CM | POA: Insufficient documentation

## 2013-05-26 DIAGNOSIS — R52 Pain, unspecified: Secondary | ICD-10-CM | POA: Insufficient documentation

## 2013-05-26 DIAGNOSIS — G8929 Other chronic pain: Secondary | ICD-10-CM | POA: Insufficient documentation

## 2013-05-26 DIAGNOSIS — M5430 Sciatica, unspecified side: Secondary | ICD-10-CM | POA: Insufficient documentation

## 2013-05-26 DIAGNOSIS — M5030 Other cervical disc degeneration, unspecified cervical region: Secondary | ICD-10-CM | POA: Insufficient documentation

## 2013-05-26 DIAGNOSIS — M129 Arthropathy, unspecified: Secondary | ICD-10-CM | POA: Insufficient documentation

## 2013-05-26 DIAGNOSIS — F17200 Nicotine dependence, unspecified, uncomplicated: Secondary | ICD-10-CM | POA: Insufficient documentation

## 2013-05-26 MED ORDER — HYDROCODONE-ACETAMINOPHEN 5-325 MG PO TABS: 1.0000 | ORAL_TABLET | Freq: Four times a day (QID) | ORAL | Status: DC | PRN

## 2013-05-26 MED ORDER — METHOCARBAMOL 500 MG PO TABS: 500.0000 mg | ORAL_TABLET | Freq: Once | ORAL | Status: DC

## 2013-05-26 MED ORDER — PREDNISONE 50 MG PO TABS: 50.0000 mg | ORAL_TABLET | Freq: Once | ORAL | Status: DC

## 2013-05-26 MED ORDER — CYCLOBENZAPRINE HCL 10 MG PO TABS
5.0000 mg | ORAL_TABLET | Freq: Once | ORAL | Status: AC
  Administered 2013-05-26: 5 mg via ORAL
  Filled 2013-05-26: qty 1

## 2013-05-26 MED ORDER — PREDNISONE 20 MG PO TABS: ORAL_TABLET | ORAL | Status: DC

## 2013-05-26 MED ORDER — CYCLOBENZAPRINE HCL 10 MG PO TABS: 10.0000 mg | ORAL_TABLET | Freq: Two times a day (BID) | ORAL | Status: DC | PRN

## 2013-05-26 MED ORDER — HYDROCODONE-ACETAMINOPHEN 5-325 MG PO TABS
1.0000 | ORAL_TABLET | Freq: Once | ORAL | Status: AC
  Administered 2013-05-26: 1 via ORAL
  Filled 2013-05-26: qty 1

## 2013-05-26 NOTE — ED Notes (Signed)
Pain low back , down  Back of rt leg to knee.  Onset last night when carrying wood into her home.  No fall or other injury involved.

## 2013-05-26 NOTE — ED Provider Notes (Signed)
CSN: [id number]     Arrival date & time 05/26/13  1023 History   First MD Initiated Contact with Patient 05/26/13 1025     Chief Complaint  Patient presents with  . Back Pain     (Consider location/radiation/quality/duration/timing/severity/associated sxs/prior Treatment) The history is provided by the patient. No language interpreter was used.  Lisa Mcclain is a 43 year old female with past medical history of celiac disease, back pain, nerve compression, arthritis, right-sided sciatica, degeneration of cervical intravertebral disc presenting to the emergency department with exacerbation of lower back pain. Patient reports she has a chronic back pain issue that got progressively worse starting last night. Stated that her husband has rib fractures which limits his ability to perform tasks around the house. Stated she was lifting and stacking wood yesterday with her daughters-reported that she lifted a heavy log and tweaked her back. Stated that she's been experiencing lower back pain described as tension, stiffening, spasm sensation that is constant with intermittent tightness. Reported that the pain radiates down her right leg. Stated that she's been using Tylenol with minimal relief. Reported that when she gets exacerbation of her back pain and this is how she normally presents. Denied direct fall or trauma to the back, nausea, vomiting, chest pain, shortness of breath, difficulty breathing, urinary and bowel incontinence, loss of sensation, numbness, tingling. PCP none  Past Medical History  Diagnosis Date  . Celiac disease   . Back pain   . Tachycardia   . Nerve compression   . Arthritis   . Degeneration of cervical intervertebral disc   . Sciatica    Past Surgical History  Procedure Laterality Date  . I&d extremity  04/20/2012    Procedure: IRRIGATION AND DEBRIDEMENT EXTREMITY;  Surgeon: Marlowe ShoresMatthew A Weingold, MD;  Location: [facility];  Service: Orthopedics;  Laterality:  Left;   Family History  Problem Relation Age of Onset  . Cancer Other    History  Substance Use Topics  . Smoking status: Current Every Day Smoker -- 1.00 packs/day for 28 years    Types: Cigarettes  . Smokeless tobacco: Never Used  . Alcohol Use: Yes     Comment: occasional use   OB History   Grav Para Term Preterm Abortions TAB SAB Ect Mult Living   3 2   1  1   2      Review of Systems  Constitutional: Negative for fever and chills.  Respiratory: Negative for chest tightness and shortness of breath.   Cardiovascular: Negative for chest pain.  Gastrointestinal: Negative for nausea and vomiting.  Musculoskeletal: Positive for back pain and myalgias (Right leg posterior).  Neurological: Negative for weakness and numbness.  All other systems reviewed and are negative.      Allergies  Cephalexin; Erythromycin; Gluten; Latex; Tramadol; Wheat; and Penicillins  Home Medications   Current Outpatient Rx  Name  Route  Sig  Dispense  Refill  . acetaminophen (TYLENOL) 500 MG tablet   Oral   Take 1,000 mg by mouth every 8 (eight) hours as needed for moderate pain.         Marland Kitchen. ibuprofen (ADVIL,MOTRIN) 200 MG tablet   Oral   Take 400 mg by mouth every 6 (six) hours as needed for pain.         . cyclobenzaprine (FLEXERIL) 10 MG tablet   Oral   Take 1 tablet (10 mg total) by mouth 2 (two) times daily as needed for muscle spasms.   20 tablet  0   . HYDROcodone-acetaminophen (NORCO/VICODIN) 5-325 MG per tablet   Oral   Take 1 tablet by mouth every 6 (six) hours as needed.   7 tablet   0   . predniSONE (DELTASONE) 20 MG tablet      3 tabs po day one, then 2 tabs daily x 4 days   11 tablet   0    BP 149/93  Pulse 97  Temp(Src) 98.4 F (36.9 C) (Oral)  Resp 19  SpO2 100% Physical Exam  Nursing note and vitals reviewed. Constitutional: She is oriented to person, place, and time. She appears well-developed and well-nourished. No distress.  Patient sitting  comfortably upright in bed  HENT:  Head: Normocephalic and atraumatic.  Mouth/Throat: Oropharynx is clear and moist. No oropharyngeal exudate.  Eyes: Conjunctivae and EOM are normal. Pupils are equal, round, and reactive to light. Right eye exhibits no discharge. Left eye exhibits no discharge.  Neck: Normal range of motion. Neck supple.  Negative neck stiffness Negative rigidity Negative pain upon palpation to the C-spine  Cardiovascular: Normal rate, regular rhythm and normal heart sounds.   Pulses:      Radial pulses are 2+ on the right side, and 2+ on the left side.       Dorsalis pedis pulses are 2+ on the right side, and 2+ on the left side.  Pulmonary/Chest: Effort normal and breath sounds normal. No respiratory distress. She has no wheezes. She has no rales.  Musculoskeletal: Normal range of motion. She exhibits tenderness.       Back:  Negative swelling, erythema, lesions, bulging, deformities noted to cervical/thoracic/lumbosacral/coccyx. Discomfort upon palpation to mid lower lumbosacral and coccyx region-discomfort upon palpation to right aspect of the lumbosacral coccyx region. Full range of motion to upper extremities without difficulty. Full range of motion to left lower extremity. Mildly decreased range of motion to right lower extremity secondary to pain.  Neurological: She is alert and oriented to person, place, and time. No cranial nerve deficit. She exhibits normal muscle tone. Coordination normal.  Cranial nerves III-XII grossly intact Strength 5+/5+ to upper and lower extremities bilaterally with resistance applied, equal distribution noted Equal grip strength Negative saddle paresthesias bilaterally Sensation intact with differentiation to sharp and dull touch to lower extremities bilaterally Gait proper, proper balance - negative sway, negative drift, negative step-offs  Skin: Skin is warm and dry. No rash noted. She is not diaphoretic. No erythema.  Psychiatric: She  has a normal mood and affect. Her behavior is normal. Thought content normal.    ED Course  Procedures (including critical care time) Labs Review Labs Reviewed - No data to display Imaging Review No results found.  EKG Interpretation   None       MDM   Final diagnoses:  Acute exacerbation of chronic low back pain  Sciatica    Medications  HYDROcodone-acetaminophen (NORCO/VICODIN) 5-325 MG per tablet 1 tablet (1 tablet Oral Given 05/26/13 1125)  cyclobenzaprine (FLEXERIL) tablet 5 mg (5 mg Oral Given 05/26/13 1125)    Filed Vitals:   05/26/13 1050  BP: 149/93  Pulse: 97  Temp: 98.4 F (36.9 C)  TempSrc: Oral  Resp: 19  SpO2: 100%   Patient presenting to emergency department with back pain localized to the lower back with radiation down the right leg that started yesterday described as a constant tension, stiffening, spasm pain with intermittent tightness. Reported using Tylenol with minimal relief. Patient reported that she normally presents in this manner with exacerbation  of her chronic back pain-reported that she normally has right-sided sciatica. Denied direct trauma to the back, numbness, tingling, loss of sensation, urinary or bowel incontinence. Alert and oriented. GCS 15. Heart rate and rhythm normal. Lungs clear to auscultation. Radial and DP pulses 2+ bilaterally. Full range of motion to upper extremities bilaterally. Negative deformities noted to cervical/thoracic/lumbosacral/coccyx region of the spine. Discomfort upon palpation to mid lumbosacral, coccyx and right-sided region upon palpation. Full range of motion noted to the left lower extremity. Decreased range of motion to the right lower extremity secondary to pain. Strength intact with equal distribution upon resistance applied to lower extremities bilaterally. Equal grip strength. Sensation intact with differentiation to sharp and dull touch to lower extremities bilaterally. Negative saddle paresthesias  bilaterally. Doubt cauda equina. Doubt epidural abscess. No imaging needed at this time secondary to no traumatic injury directly to the back and no changes to pain pattern. Patient has history of chronic back pain with right-sided sciatica-suspicion to be acute exacerbation of chronic back pain secondary to heavy lifting that occurred yesterday. Negative focal neurological deficits noted. Patient neurovascularly intact. Patient stable, afebrile. Pain medications administered in ED setting. Discharged patient with small dose of pain medications and muscle relaxers-discussed course, precautions, disposal technique. Discussed with patient to rest and stay hydrated. Discussed with patient to avoid any physical or strenuous activity. Discussed with patient warm compressions. Referred patient to orthopedics and neurosurgery. Discussed with patient to closely monitor symptoms and if symptoms are to worsen or change to report back to the ED - strict return instructions given.  Patient agreed to plan of care, understood, all questions answered.     Raymon Mutton, PA-C 05/26/13 1801

## 2013-05-26 NOTE — ED Notes (Signed)
Chronic lower back pain that was irritated picking up wood last night. Right lower back pain radiating down to right knee. Took tylenol at home with no releif. Nad.

## 2013-05-26 NOTE — ED Provider Notes (Signed)
Medical screening examination/treatment/procedure(s) were performed by non-physician practitioner and as supervising physician I was immediately available for consultation/collaboration.  EKG Interpretation   None        Courtney F Horton, MD 05/26/13 1853 

## 2013-05-26 NOTE — Discharge Instructions (Signed)
Please call your doctor for a followup appointment within 24-48 hours. When you talk to your doctor please let them know that you were seen in the emergency department and have them acquire all of your records so that they can discuss the findings with you and formulate a treatment plan to fully care for your new and ongoing problems. Please call and set-up an appointment with orthopedic and neurosurgery regarding ongoing back pain Please take medications as prescribed While on pain medications his be absolutely no drinking alcohol, driving, operating any heavy machinery. If there is extra please dispose in a proper manner. Please do not take extra Tylenol with this medication for this can lead to Tylenol overdose and liver issues. Please apply warm compressions to the lower back to aid in muscular relief-please massage with icy hot ointment Please avoid any physical or strenuous activity-please no heavy lifting for the next couple of days Please continue to monitor symptoms closely and if symptoms are to worsen or change (fever greater than and 1, chest pain, shortness of breath, difficulty breathing, numbness, tingling, fall, injury, inability wall, and to control urine or bowel movements) please report back to the ED immediately  Back Pain, Adult Low back pain is very common. About 1 in 5 people have back pain.The cause of low back pain is rarely dangerous. The pain often gets better over time.About half of people with a sudden onset of back pain feel better in just 2 weeks. About 8 in 10 people feel better by 6 weeks.  CAUSES Some common causes of back pain include:  Strain of the muscles or ligaments supporting the spine.  Wear and tear (degeneration) of the spinal discs.  Arthritis.  Direct injury to the back. DIAGNOSIS Most of the time, the direct cause of low back pain is not known.However, back pain can be treated effectively even when the exact cause of the pain is  unknown.Answering your caregiver's questions about your overall health and symptoms is one of the most accurate ways to make sure the cause of your pain is not dangerous. If your caregiver needs more information, he or she may order lab work or imaging tests (X-rays or MRIs).However, even if imaging tests show changes in your back, this usually does not require surgery. HOME CARE INSTRUCTIONS For many people, back pain returns.Since low back pain is rarely dangerous, it is often a condition that people can learn to [facility] their own.   Remain active. It is stressful on the back to sit or stand in one place. Do not sit, drive, or stand in one place for more than 30 minutes at a time. Take short walks on level surfaces as soon as pain allows.Try to increase the length of time you walk each day.  Do not stay in bed.Resting more than 1 or 2 days can delay your recovery.  Do not avoid exercise or work.Your body is made to move.It is not dangerous to be active, even though your back may hurt.Your back will likely heal faster if you return to being active before your pain is gone.  Pay attention to your body when you bend and lift. Many people have less discomfortwhen lifting if they bend their knees, keep the load close to their bodies,and avoid twisting. Often, the most comfortable positions are those that put less stress on your recovering back.  Find a comfortable position to sleep. Use a firm mattress and lie on your side with your knees slightly bent. If you lie  on your back, put a pillow under your knees.  Only take over-the-counter or prescription medicines as directed by your caregiver. Over-the-counter medicines to reduce pain and inflammation are often the most helpful.Your caregiver may prescribe muscle relaxant drugs.These medicines help dull your pain so you can more quickly return to your normal activities and healthy exercise.  Put ice on the injured area.  Put ice in a  plastic bag.  Place a towel between your skin and the bag.  Leave the ice on for 15-20 minutes, 03-04 times a day for the first 2 to 3 days. After that, ice and heat may be alternated to reduce pain and spasms.  Ask your caregiver about trying back exercises and gentle massage. This may be of some benefit.  Avoid feeling anxious or stressed.Stress increases muscle tension and can worsen back pain.It is important to recognize when you are anxious or stressed and learn ways to manage it.Exercise is a great option. SEEK MEDICAL CARE IF:  You have pain that is not relieved with rest or medicine.  You have pain that does not improve in 1 week.  You have new symptoms.  You are generally not feeling well. SEEK IMMEDIATE MEDICAL CARE IF:   You have pain that radiates from your back into your legs.  You develop new bowel or bladder control problems.  You have unusual weakness or numbness in your arms or legs.  You develop nausea or vomiting.  You develop abdominal pain.  You feel faint. Document Released: 03/23/2005 Document Revised: 09/22/2011 Document Reviewed: 08/11/2010 [facility] Patient Information 2014 [place], [place].  Lumbosacral Strain Lumbosacral strain is a strain of any of the parts that make up your lumbosacral vertebrae. Your lumbosacral vertebrae are the bones that make up the lower third of your backbone. Your lumbosacral vertebrae are held together by muscles and tough, fibrous tissue (ligaments).  CAUSES  A sudden blow to your back can cause lumbosacral strain. Also, anything that causes an excessive stretch of the muscles in the low back can cause this strain. This is typically seen when people exert themselves strenuously, fall, lift heavy objects, bend, or crouch repeatedly. RISK FACTORS  Physically demanding work.  Participation in pushing or pulling sports or sports that require sudden twist of the back (tennis, golf, baseball).  Weight  lifting.  Excessive lower back curvature.  Forward-tilted pelvis.  Weak back or abdominal muscles or both.  Tight hamstrings. SIGNS AND SYMPTOMS  Lumbosacral strain may cause pain in the area of your injury or pain that moves (radiates) down your leg.  DIAGNOSIS Your health care provider can often diagnose lumbosacral strain through a physical exam. In some cases, you may need tests such as X-ray exams.  TREATMENT  Treatment for your lower back injury depends on many factors that your clinician will have to evaluate. However, most treatment will include the use of anti-inflammatory medicines. HOME CARE INSTRUCTIONS   Avoid hard physical activities (tennis, racquetball, waterskiing) if you are not in proper physical condition for it. This may aggravate or create problems.  If you have a back problem, avoid sports requiring sudden body movements. Swimming and walking are generally safer activities.  Maintain good posture.  Maintain a healthy weight.  For acute conditions, you may put ice on the injured area.  Put ice in a plastic bag.  Place a towel between your skin and the bag.  Leave the ice on for 20 minutes, 2 3 times a day.  When the low back starts  healing, stretching and strengthening exercises may be recommended. SEEK MEDICAL CARE IF:  Your back pain is getting worse.  You experience severe back pain not relieved with medicines. SEEK IMMEDIATE MEDICAL CARE IF:   You have numbness, tingling, weakness, or problems with the use of your arms or legs.  There is a change in bowel or bladder control.  You have increasing pain in any area of the body, including your belly (abdomen).  You notice shortness of breath, dizziness, or feel faint.  You feel sick to your stomach (nauseous), are throwing up (vomiting), or become sweaty.  You notice discoloration of your toes or legs, or your feet get very cold. MAKE SURE YOU:   Understand these instructions.  Will watch  your condition.  Will get help right away if you are not doing well or get worse. Document Released: 12/31/2004 Document Revised: 01/11/2013 Document Reviewed: 11/09/2012 [facility] Patient Information 2014 [place], [place].   Emergency Department Resource Guide 1) Find a Doctor and Pay Out of Pocket Although you won't have to find out who is covered by your insurance plan, it is a good idea to ask around and get recommendations. You will then need to call the office and see if the doctor you have chosen will accept you as a new patient and what types of options they offer for patients who are self-pay. Some doctors offer discounts or will set up payment plans for their patients who do not have insurance, but you will need to ask so you aren't surprised when you get to your appointment.  2) Contact Your Local Health Department Not all health departments have doctors that can see patients for sick visits, but many do, so it is worth a call to see if yours does. If you don't know where your local health department is, you can check in your phone book. The CDC also has a tool to help you locate your state's health department, and many state websites also have listings of all of their local health departments.  3) Find a Walk-in Clinic If your illness is not likely to be very severe or complicated, you may want to try a walk in clinic. These are popping up all over the country in pharmacies, drugstores, and shopping centers. They're usually staffed by nurse practitioners or physician assistants that have been trained to treat common illnesses and complaints. They're usually fairly quick and inexpensive. However, if you have serious medical issues or chronic medical problems, these are probably not your best option.  No Primary Care Doctor: - Call Health Connect at  [phone] - they can help you locate a primary care doctor that  accepts your insurance, provides certain services, etc. - Physician Referral  Service- [phone]  Chronic Pain Problems: Organization         Address  Phone   Notes  [facility]  [phone] Patients need to be referred by their primary care doctor.   Medication Assistance: Organization         Address  Phone   Notes  [facility] [address] [place] [place], [place] [address] [phone] --Must be a resident of [facility] -- Must have NO insurance coverage whatsoever (no Medicaid/ Medicare, etc.) -- The pt. MUST have a primary care doctor that directs their care regularly and follows them in the community   [facility]  [phone]   [employer]  [phone]    Agencies that provide  inexpensive medical care: Organization         Address  Phone   Notes  [facility]  [phone]   [facility]    [phone]   [facility] [address] [place], [place] [address] [phone]   [facility] [address] [place], [place] [phone]   Planned Parenthood    [phone]   [facility]    [phone]   [facility]  [address], [place] Phone:  [phone], Fax:  [phone] Hours of Operation:  9 am - 6 pm, M-F.  Also accepts Medicaid/Medicare and self-pay.  [facility]  [address], [place] Phone: [phone], Fax: [phone]. Hours of Operation:  8:30 am - 5:30 pm, M-F.  Also accepts Medicaid and self-pay.  [facility] [place] [address], [place] Phone: [phone]   [facility] [address] Natasha Bence [place], [place] [phone], Ext. [phone] Mondays & Thursdays: 7-9 AM.  First 15 patients are seen on a first come, first serve basis.    Medicaid-accepting [facility] Providers:  Organization         Address  Phone   Notes  [facility] [address], [place] [phone] Also accepts self-pay patients.  [facility] [address] Laurell Josephs [place], [place]  [phone]   [facility] [address], [place] [phone]   [facility] [address], [place] [phone]   Renaye Rakers [address], [place]   [phone] Only accepts [place] Access [place] patients after they have their name applied to their card.   Self-Pay (no insurance) in [facility]:  Organization         Address  Phone   Notes  Sickle Cell Patients, [facility] [address] [place], [place] [phone]   [facility] [address] [place], [place] [phone]   [facility] [place]  [address], [place] [phone]   [facility]/Dr. Osei-Bonsu  [address], [place] or [address], [place] [phone] Phone number for both [place] and [place] locations is the same.  [facility] [address] [place] [phone]   [facility] [address], [place] or [address] [phone]   [facility] [address] [place] [phone], phone; [phone], fax Sees patients 1st and 3rd Saturday of every month.  Must not qualify for public or private insurance (i.e. Medicaid, Medicare, [place] Health Choice, Veterans' Benefits)  Household income should be no more than 200% of the poverty level The clinic cannot treat you if you are pregnant or think you are pregnant  Sexually transmitted diseases are not treated at the clinic.    Dental Care: Organization         Address  Phone  Notes  [facility] Department of [facility] [address] [place], [place] [phone] Accepts children up to age 59 who are enrolled in  [place] or [place] Health Choice; pregnant women with a Medicaid card; and children who have applied for Medicaid or  Health Choice,  but were declined, whose parents can pay a reduced fee at time of service.  [facility] Department of [facility]  [address], [place] [phone] Accepts children up to age 55 who are enrolled in [place] or [place] Health Choice; pregnant women with a Medicaid card; and children who have applied for Medicaid or  Health Choice, but were declined, whose parents can pay a reduced fee at time of service.  [facility]  [address] [place], [place] [phone] Patients are seen by appointment only. Walk-ins are not accepted. [facility] will see patients 75 years of age and older. Monday - Tuesday (8am-5pm) Most Wednesdays (8:30-5pm) $30 per visit, cash only  [facility]  [address], [facility] [phone] Patients are seen by appointment only. Walk-ins are not accepted. [facility] will see patients 9 years of age and older. One Wednesday Evening (Monthly: Volunteer Based).  $30 per visit, cash only  [employer]  [phone] for adults; Children under age 80, call Graduate Pediatric Dentistry at [phone]. Children aged 12-14, please call [phone] to request a pediatric application.  Dental services are provided in all areas of dental care including fillings, crowns and bridges, complete and partial dentures, implants, gum treatment, root canals, and extractions. Preventive care is also provided. Treatment is provided to both adults and children. Patients are selected via a lottery and there is often a waiting list.   [facility] [address] [place]  [phone] [email or web address]   [facility] [address] [place], [place] [phone], Ext. [phone] Second and Fourth Thursday of each month, opens at 6:30  AM; Clinic ends at 9 AM.  Patients are seen on a first-come first-served basis, and a limited number are seen during each clinic.   [facility]  [address] Ether Griffins [place], [place] [phone]   Eligibility Requirements You must have lived in [place], [place], or [place] counties for at least the last three months.   You cannot be eligible for state or federal sponsored [place], including [employer], [place], or [employer].   You generally cannot be eligible for healthcare insurance through your employer.    How to apply: Eligibility screenings are held every Tuesday and Wednesday afternoon from 1:00 pm until 4:00 pm. You do not need an appointment for the interview!  [employer] [facility] [address], [place], [place] [phone]   [facility] Health Department  [phone]   [facility] Health Department  [phone]   [facility] Health Department  [phone]    Behavioral Health Resources in the Community: Intensive Outpatient Programs Organization         Address  Phone  Notes  [facility] Services [address] [place], [place] [phone]   [facility]patient [address] [place], [place] [phone]   [facility] [address] [place], [place]  [phone]   [facility] [address]  [place], [place] [phone] or [phone]   Substance Abuse Resources Organization         Address  Phone  Notes  [facility]  [phone]   [facility]  [phone]   [facility] [place]  9526905956   Floydene Flock  [phone]   Residential & Outpatient Substance Abuse Program  [phone]   Psychological Services  Organization         Address  Phone  Notes  [employer] [facility]  [phone]   [facility] Services  [phone]   [facility] [address] [place] [phone] or  7151560344    Mobile Crisis Teams Organization         Address  Phone  Notes  [facility], Mobile Crisis Care Unit  [phone]   [facility]  [address] [place], [place] [phone]   Doristine Locks [address] [place] [phone]    Self-Help/Support Groups Organization         Address  Phone             Notes  [facility] [place] [facility] - variety of support groups  336- I7437963 Call for more information  Narcotics Anonymous (NA), [facility] [address], [employer] [place]  2 meetings at this location   Statistician         Address  Phone  Notes  [facility] [address],    [place]  [phone]   [facility]  [address] [place] [address], [place], [place] [phone]   [facility] [address] [place], [place] [phone] Admissions: 8am-3pm M-F  [facility] [address]    [place], [place] [phone]   [facility] [address] [place], [place], [place] [phone]   [facility] [address],  [place], [place] [phone]   [facility] - Intensive Outpatient [address] Laurell Josephs [address], [place], [place] [phone]   [facility] (Addiction Recovery [facility] [address] [place]  [place], [place] [phone] or [phone]   [facility] ([facility]) [address], [place], [place] [phone] Accepts Medicaid  [facility] [address],  [place] [phone] Substance Abuse/Addiction Treatment   [facility] Organization         Address  Phone  Notes  [facility]  9058642644   Angie Fava, PhD [address] Ervin Knack [place], [place]   [phone] or [phone]   [facility] Behavioral   [address] [place], [place] [phone]   [facility] [address]  [place], [place] [phone] Insurance/Medicaid/sponsorship through [facility] and Families [address]                                    [place], [place] [phone] Therapy/tele-psych/case  [facility] [address], [place] [phone]    Dr. Lolly Mustache  [phone]   [facility]  [facility] Dept. 1) [address],  2) [address] [place] 3)  [address], [place] [phone]  [phone]   [facility] Child Abuse Hotline 905-796-3756 or [phone] (After Hours)

## 2013-10-03 ENCOUNTER — Encounter (HOSPITAL_COMMUNITY): Payer: Self-pay | Admitting: Emergency Medicine

## 2013-10-03 ENCOUNTER — Emergency Department (HOSPITAL_COMMUNITY)
Admission: EM | Admit: 2013-10-03 | Discharge: 2013-10-03 | Disposition: A | Discharge status: Home / Self Care | Payer: Self-pay | Attending: Emergency Medicine | Admitting: Emergency Medicine

## 2013-10-03 DIAGNOSIS — M129 Arthropathy, unspecified: Secondary | ICD-10-CM | POA: Insufficient documentation

## 2013-10-03 DIAGNOSIS — Z880 Allergy status to penicillin: Secondary | ICD-10-CM | POA: Insufficient documentation

## 2013-10-03 DIAGNOSIS — M436 Torticollis: Secondary | ICD-10-CM | POA: Insufficient documentation

## 2013-10-03 DIAGNOSIS — R000 Tachycardia, unspecified: Secondary | ICD-10-CM | POA: Insufficient documentation

## 2013-10-03 DIAGNOSIS — F17200 Nicotine dependence, unspecified, uncomplicated: Secondary | ICD-10-CM | POA: Insufficient documentation

## 2013-10-03 DIAGNOSIS — M25519 Pain in unspecified shoulder: Secondary | ICD-10-CM | POA: Insufficient documentation

## 2013-10-03 DIAGNOSIS — Z8719 Personal history of other diseases of the digestive system: Secondary | ICD-10-CM | POA: Insufficient documentation

## 2013-10-03 DIAGNOSIS — Z91040 Latex allergy status: Secondary | ICD-10-CM | POA: Insufficient documentation

## 2013-10-03 DIAGNOSIS — Z87828 Personal history of other (healed) physical injury and trauma: Secondary | ICD-10-CM | POA: Insufficient documentation

## 2013-10-03 MED ORDER — DICLOFENAC SODIUM 75 MG PO TBEC: 75.0000 mg | DELAYED_RELEASE_TABLET | Freq: Two times a day (BID) | ORAL | Status: DC

## 2013-10-03 MED ORDER — DIAZEPAM 5 MG PO TABS: 5.0000 mg | ORAL_TABLET | Freq: Three times a day (TID) | ORAL | Status: DC

## 2013-10-03 MED ORDER — HYDROCODONE-ACETAMINOPHEN 5-325 MG PO TABS: 1.0000 | ORAL_TABLET | ORAL | Status: DC | PRN

## 2013-10-03 NOTE — ED Provider Notes (Signed)
CSN: [id number]     Arrival date & time 10/03/13  1421 History   First MD Initiated Contact with Patient 10/03/13 1501     Chief Complaint  Patient presents with  . Neck Pain     (Consider location/radiation/quality/duration/timing/severity/associated sxs/prior Treatment) HPI Comments: Patient is a 43 year old female who presents to the emergency department with complaint of left neck and shoulder pain. Patient states that this pain is been going on for nearly a week. She has been doing extra work on her job that involved lifting, pushing, pulling. She's not had any blood or direct trauma to the left shoulder and neck. It is also of note that the patient has cervical degenerative disc disease. It is of note that the patient has also not had any previous operations or procedures involving the left neck, or shoulder. Patient is not on any anticoagulation medications. She has tried acetaminophen but this has not been successful.  The history is provided by the patient.    Past Medical History  Diagnosis Date  . Celiac disease   . Back pain   . Tachycardia   . Nerve compression   . Arthritis   . Degeneration of cervical intervertebral disc   . Sciatica    Past Surgical History  Procedure Laterality Date  . I&d extremity  04/20/2012    Procedure: IRRIGATION AND DEBRIDEMENT EXTREMITY;  Surgeon: Marlowe ShoresMatthew A Weingold, MD;  [facility]:  [facility];  Service: Orthopedics;  Laterality: Left;   Family History  Problem Relation Age of Onset  . Cancer Other    History  Substance Use Topics  . Smoking status: Current Every Day Smoker -- 1.00 packs/day for 28 years    Types: Cigarettes  . Smokeless tobacco: Never Used  . Alcohol Use: Yes     Comment: occasional use   OB History   Grav Para Term Preterm Abortions TAB SAB Ect Mult Living   3 2   1  1   2      Review of Systems  Constitutional: Negative for activity change.       All ROS Neg except as noted in HPI  HENT:  Negative for nosebleeds.   Eyes: Negative for photophobia and discharge.  Respiratory: Negative for cough, shortness of breath and wheezing.   Cardiovascular: Negative for chest pain and palpitations.  Gastrointestinal: Negative for abdominal pain and blood in stool.  Genitourinary: Negative for dysuria, frequency and hematuria.  Musculoskeletal: Positive for arthralgias and back pain. Negative for neck pain.  Skin: Negative.   Neurological: Negative for dizziness, seizures and speech difficulty.  Psychiatric/Behavioral: Negative for hallucinations and confusion.      Allergies  Cephalexin; Erythromycin; Gluten; Tramadol; Wheat; Latex; and Penicillins  Home Medications   Prior to Admission medications   Medication Sig Start Date End Date Taking? Authorizing Diania Co  acetaminophen (TYLENOL) 500 MG tablet Take 1,000 mg by mouth every 8 (eight) hours as needed for moderate pain.   Yes Historical Jaine Estabrooks, MD   BP 141/79  Temp(Src) 98.3 F (36.8 C) (Oral)  Ht 5\' 4"  (1.626 m)  Wt 160 lb (72.576 kg)  BMI 27.45 kg/m2  SpO2 100%  LMP 10/03/2013 Physical Exam  Nursing note and vitals reviewed. Constitutional: She is oriented to person, place, and time. She appears well-developed and well-nourished.  Non-toxic appearance.  HENT:  Head: Normocephalic.  Right Ear: Tympanic membrane and external ear normal.  Left Ear: Tympanic membrane and external ear normal.  Eyes: EOM and lids are normal. Pupils  are equal, round, and reactive to light.  Neck: Trachea normal. Neck supple. Normal carotid pulses present. Muscular tenderness present. No spinous process tenderness present. Carotid bruit is not present. Erythema and decreased range of motion present. No edema present.  Left trapezius  Tenderness to palpation and attempted ROM.   Cardiovascular: Normal rate, regular rhythm, normal heart sounds, intact distal pulses and normal pulses.   Pulmonary/Chest: Breath sounds normal. No respiratory  distress.  Abdominal: Soft. Bowel sounds are normal. There is no tenderness. There is no guarding.  Musculoskeletal:       Cervical back: She exhibits decreased range of motion, tenderness, pain and spasm. She exhibits no edema and no deformity.       Arms: No step off of the c spine.  Lymphadenopathy:       Head (right side): No submandibular adenopathy present.       Head (left side): No submandibular adenopathy present.    She has no cervical adenopathy.  Neurological: She is alert and oriented to person, place, and time. She has normal strength. No cranial nerve deficit or sensory deficit.  Skin: Skin is warm and dry.  Psychiatric: She has a normal mood and affect. Her speech is normal.    ED Course  Procedures (including critical care time) Labs Review Labs Reviewed - No data to display  Imaging Review No results found.   EKG Interpretation None      MDM Examination is consistent with a torticolis is involving the left neck and shoulder. The patient is driving and medications could not be administered here in the emergency department. A prescription for Valium 5 mg 3 times daily, diclofenac 2 times daily, and Norco every 4 hours given to the patient. Patient advised to use heat to the area, and to return if not improving.    Final diagnoses:  Torticollis    **I have reviewed nursing notes, vital signs, and all appropriate lab and imaging results for this patient.Kathie Dike*    Hobson M Bryant, PA-C 10/03/13 1558  Kathie DikeHobson M Bryant, PA-C 10/04/13 2209

## 2013-10-03 NOTE — ED Notes (Signed)
Pt c/o of neck pain times 1 week with radiation into left shoulder and arm.

## 2013-10-03 NOTE — Discharge Instructions (Signed)
Please apply heat to your neck and shoulder. Please use Valium 3 times daily, and diclofenac 2 times daily. Use Norco every 4 hours if needed for pain. Valium Torticollis, Acute You have suddenly (acutely) developed a twisted neck (torticollis). This is usually a self-limited condition. CAUSES  Acute torticollis may be caused by malposition, trauma or infection. Most commonly, acute torticollis is caused by sleeping in an awkward position. Torticollis may also be caused by the flexion, extension or twisting of the neck muscles beyond their normal position. Sometimes, the exact cause may not be known. SYMPTOMS  Usually, there is pain and limited movement of the neck. Your neck may twist to one side. DIAGNOSIS  The diagnosis is often made by physical examination. X-rays, CT scans or MRIs may be done if there is a history of trauma or concern of infection. TREATMENT  For a common, stiff neck that develops during sleep, treatment is focused on relaxing the contracted neck muscle. Medications (including shots) may be used to treat the problem. Most cases resolve in several days. Torticollis usually responds to conservative physical therapy. If left untreated, the shortened and spastic neck muscle can cause deformities in the face and neck. Rarely, surgery is required. HOME CARE INSTRUCTIONS   Use over-the-counter and prescription medications as directed by your caregiver.  Do stretching exercises and massage the neck as directed by your caregiver.  Follow up with physical therapy if needed and as directed by your caregiver. SEEK IMMEDIATE MEDICAL CARE IF:   You develop difficulty breathing or noisy breathing (stridor).  You drool, develop trouble swallowing or have pain with swallowing.  You develop numbness or weakness in the hands or feet.  You have changes in speech or vision.  You have problems with urination or bowel movements.  You have difficulty walking.  You have a fever.  You  have increased pain. MAKE SURE YOU:   Understand these instructions.  Will watch your condition.  Will get help right away if you are not doing well or get worse. Document Released: 03/20/2000 Document Revised: 06/15/2011 Document Reviewed: 05/01/2009 [facility] Patient Information 2015 [employer], [place]. This information is not intended to replace advice given to you by your health care provider. Make sure you discuss any questions you have with your health care provider.  and Norco may cause drowsiness, please use with caution.

## 2013-10-05 NOTE — ED Provider Notes (Signed)
Medical screening examination/treatment/procedure(s) were performed by non-physician practitioner and as supervising physician I was immediately available for consultation/collaboration.   EKG Interpretation None        Joya Gaskinsonald W Myers [place], MD 10/05/13 [phone]

## 2013-10-31 ENCOUNTER — Emergency Department (HOSPITAL_COMMUNITY)
Admission: EM | Admit: 2013-10-31 | Discharge: 2013-10-31 | Disposition: A | Discharge status: Home / Self Care | Payer: Self-pay | Attending: Emergency Medicine | Admitting: Emergency Medicine

## 2013-10-31 ENCOUNTER — Encounter (HOSPITAL_COMMUNITY): Payer: Self-pay | Admitting: Emergency Medicine

## 2013-10-31 DIAGNOSIS — M545 Low back pain, unspecified: Secondary | ICD-10-CM | POA: Insufficient documentation

## 2013-10-31 DIAGNOSIS — M5432 Sciatica, left side: Secondary | ICD-10-CM

## 2013-10-31 DIAGNOSIS — Z8719 Personal history of other diseases of the digestive system: Secondary | ICD-10-CM | POA: Insufficient documentation

## 2013-10-31 DIAGNOSIS — M129 Arthropathy, unspecified: Secondary | ICD-10-CM | POA: Insufficient documentation

## 2013-10-31 DIAGNOSIS — Z91040 Latex allergy status: Secondary | ICD-10-CM | POA: Insufficient documentation

## 2013-10-31 DIAGNOSIS — Z880 Allergy status to penicillin: Secondary | ICD-10-CM | POA: Insufficient documentation

## 2013-10-31 DIAGNOSIS — Z87828 Personal history of other (healed) physical injury and trauma: Secondary | ICD-10-CM | POA: Insufficient documentation

## 2013-10-31 DIAGNOSIS — IMO0002 Reserved for concepts with insufficient information to code with codable children: Secondary | ICD-10-CM | POA: Insufficient documentation

## 2013-10-31 DIAGNOSIS — M5430 Sciatica, unspecified side: Secondary | ICD-10-CM | POA: Insufficient documentation

## 2013-10-31 DIAGNOSIS — Z791 Long term (current) use of non-steroidal anti-inflammatories (NSAID): Secondary | ICD-10-CM | POA: Insufficient documentation

## 2013-10-31 DIAGNOSIS — F17200 Nicotine dependence, unspecified, uncomplicated: Secondary | ICD-10-CM | POA: Insufficient documentation

## 2013-10-31 MED ORDER — DEXAMETHASONE SODIUM PHOSPHATE 4 MG/ML IJ SOLN
8.0000 mg | Freq: Once | INTRAMUSCULAR | Status: AC
  Administered 2013-10-31: 8 mg via INTRAMUSCULAR
  Filled 2013-10-31: qty 2

## 2013-10-31 MED ORDER — CYCLOBENZAPRINE HCL 10 MG PO TABS: 10.0000 mg | ORAL_TABLET | Freq: Two times a day (BID) | ORAL | Status: DC | PRN

## 2013-10-31 MED ORDER — OXYCODONE-ACETAMINOPHEN 5-325 MG PO TABS
1.0000 | ORAL_TABLET | Freq: Once | ORAL | Status: AC
  Administered 2013-10-31: 1 via ORAL
  Filled 2013-10-31: qty 1

## 2013-10-31 MED ORDER — HYDROCODONE-ACETAMINOPHEN 5-325 MG PO TABS: 1.0000 | ORAL_TABLET | ORAL | Status: DC | PRN

## 2013-10-31 MED ORDER — NAPROXEN 500 MG PO TABS: 500.0000 mg | ORAL_TABLET | Freq: Two times a day (BID) | ORAL | Status: DC

## 2013-10-31 NOTE — ED Notes (Signed)
Pt verbalized understanding of no driving within 4 hours of taking vicodin due to med causes drowsiness  
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
## 2013-10-31 NOTE — ED Notes (Signed)
Pt states has chronic sciatica, having exacerbation of same since Sunday. Pain radiating from lower back down both legs.

## 2013-10-31 NOTE — ED Notes (Signed)
H. Neese, NP at bedside 

## 2013-11-01 NOTE — ED Provider Notes (Signed)
  Medical screening examination/treatment/procedure(s) were performed by non-physician practitioner and as supervising physician I was immediately available for consultation/collaboration.   EKG Interpretation None         Tersa Fotopoulos, MD 11/01/13 1808 

## 2013-12-25 ENCOUNTER — Encounter (HOSPITAL_COMMUNITY): Payer: Self-pay | Admitting: Emergency Medicine

## 2013-12-25 ENCOUNTER — Emergency Department (HOSPITAL_COMMUNITY)
Admission: EM | Admit: 2013-12-25 | Discharge: 2013-12-25 | Disposition: A | Discharge status: Home / Self Care | Payer: Self-pay | Attending: Emergency Medicine | Admitting: Emergency Medicine

## 2013-12-25 DIAGNOSIS — S4980XA Other specified injuries of shoulder and upper arm, unspecified arm, initial encounter: Secondary | ICD-10-CM | POA: Insufficient documentation

## 2013-12-25 DIAGNOSIS — Z79899 Other long term (current) drug therapy: Secondary | ICD-10-CM | POA: Insufficient documentation

## 2013-12-25 DIAGNOSIS — M6281 Muscle weakness (generalized): Secondary | ICD-10-CM | POA: Insufficient documentation

## 2013-12-25 DIAGNOSIS — M25512 Pain in left shoulder: Secondary | ICD-10-CM

## 2013-12-25 DIAGNOSIS — S3981XA Other specified injuries of abdomen, initial encounter: Secondary | ICD-10-CM | POA: Insufficient documentation

## 2013-12-25 DIAGNOSIS — Z791 Long term (current) use of non-steroidal anti-inflammatories (NSAID): Secondary | ICD-10-CM | POA: Insufficient documentation

## 2013-12-25 DIAGNOSIS — Z880 Allergy status to penicillin: Secondary | ICD-10-CM | POA: Insufficient documentation

## 2013-12-25 DIAGNOSIS — M129 Arthropathy, unspecified: Secondary | ICD-10-CM | POA: Insufficient documentation

## 2013-12-25 DIAGNOSIS — Y9289 Other specified places as the place of occurrence of the external cause: Secondary | ICD-10-CM | POA: Insufficient documentation

## 2013-12-25 DIAGNOSIS — IMO0002 Reserved for concepts with insufficient information to code with codable children: Secondary | ICD-10-CM | POA: Insufficient documentation

## 2013-12-25 DIAGNOSIS — M5430 Sciatica, unspecified side: Secondary | ICD-10-CM | POA: Insufficient documentation

## 2013-12-25 DIAGNOSIS — Y9389 Activity, other specified: Secondary | ICD-10-CM | POA: Insufficient documentation

## 2013-12-25 DIAGNOSIS — Z91040 Latex allergy status: Secondary | ICD-10-CM | POA: Insufficient documentation

## 2013-12-25 DIAGNOSIS — W010XXA Fall on same level from slipping, tripping and stumbling without subsequent striking against object, initial encounter: Secondary | ICD-10-CM | POA: Insufficient documentation

## 2013-12-25 DIAGNOSIS — M545 Low back pain: Secondary | ICD-10-CM

## 2013-12-25 DIAGNOSIS — S46909A Unspecified injury of unspecified muscle, fascia and tendon at shoulder and upper arm level, unspecified arm, initial encounter: Secondary | ICD-10-CM | POA: Insufficient documentation

## 2013-12-25 DIAGNOSIS — F17200 Nicotine dependence, unspecified, uncomplicated: Secondary | ICD-10-CM | POA: Insufficient documentation

## 2013-12-25 DIAGNOSIS — Z8719 Personal history of other diseases of the digestive system: Secondary | ICD-10-CM | POA: Insufficient documentation

## 2013-12-25 MED ORDER — HYDROCODONE-ACETAMINOPHEN 7.5-325 MG/15ML PO SOLN
10.0000 mL | Freq: Once | ORAL | Status: AC
  Administered 2013-12-25: 10 mL via ORAL
  Filled 2013-12-25: qty 15

## 2013-12-25 MED ORDER — HYDROCODONE-ACETAMINOPHEN 5-325 MG PO TABS: 2.0000 | ORAL_TABLET | ORAL | Status: DC | PRN

## 2013-12-25 MED ORDER — CYCLOBENZAPRINE HCL 10 MG PO TABS
10.0000 mg | ORAL_TABLET | Freq: Once | ORAL | Status: AC
  Administered 2013-12-25: 10 mg via ORAL
  Filled 2013-12-25: qty 1

## 2013-12-25 MED ORDER — CYCLOBENZAPRINE HCL 10 MG PO TABS: 10.0000 mg | ORAL_TABLET | Freq: Two times a day (BID) | ORAL | Status: DC | PRN

## 2013-12-25 NOTE — ED Provider Notes (Signed)
CSN: [id number]     Arrival date & time 12/25/13  1027 History  This chart was scribed for Teressa Lower, [place] with Benny Lennert, MD by Tonye Royalty, ED Scribe. This patient was seen in room APFT22/APFT22 and the patient's care was started at 11:03 AM.    Chief Complaint  Patient presents with  . Fall   The history is provided by the patient. No language interpreter was used.   HPI Comments: Lisa Mcclain is a 43 y.o. female with history of back and shoulder pain who presents to the Emergency Department complaining of increased back and shoulder pain, groin area pain, and weakness to bilateral hands since falling 2 days ago. She states she was on the bottom rung of a ladder when she slipped and fall backwards onto her back and buttocks, she denies striking her head or losing consciousness. She states the weakness in her hands is a new symptom that she has not experienced before but it doesn't seem to be there today. She reports using Ibuprofen, lavender oil, and a heating pad without remission of symptoms. She states she does not have a PCP.  Past Medical History  Diagnosis Date  . Celiac disease   . Back pain   . Tachycardia   . Nerve compression   . Arthritis   . Degeneration of cervical intervertebral disc   . Sciatica    Past Surgical History  Procedure Laterality Date  . I&d extremity  04/20/2012    Procedure: IRRIGATION AND DEBRIDEMENT EXTREMITY;  Surgeon: Marlowe Shores, MD;  Location: [facility];  Service: Orthopedics;  Laterality: Left;   Family History  Problem Relation Age of Onset  . Cancer Other    History  Substance Use Topics  . Smoking status: Current Every Day Smoker -- 1.00 packs/day for 28 years    Types: Cigarettes  . Smokeless tobacco: Never Used  . Alcohol Use: Yes     Comment: occasional use   OB History   Grav Para Term Preterm Abortions TAB SAB Ect Mult Living   Review of Systems  Genitourinary: Negative for  frequency and flank pain.  Musculoskeletal: Positive for arthralgias and back pain.       Groin area pain  Neurological: Positive for weakness (to bilateral hands). Negative for syncope and speech difficulty.      Allergies  Cephalexin; Erythromycin; Gluten; Tramadol; Wheat; Latex; and Penicillins  Home Medications   Prior to Admission medications   Medication Sig Start Date End Date Taking? Authorizing Provider  acetaminophen (TYLENOL) 500 MG tablet Take 1,000 mg by mouth every 8 (eight) hours as needed for moderate pain.    Historical Provider, MD  cyclobenzaprine (FLEXERIL) 10 MG tablet Take 1 tablet (10 mg total) by mouth 2 (two) times daily as needed for muscle spasms. 10/31/13   Hope Orlene Och, NP  diazepam (VALIUM) 5 MG tablet Take 1 tablet (5 mg total) by mouth 3 (three) times daily. 10/03/13   Kathie Dike, PA-C  diclofenac (VOLTAREN) 75 MG EC tablet Take 1 tablet (75 mg total) by mouth 2 (two) times daily. 10/03/13   Kathie Dike, PA-C  HYDROcodone-acetaminophen (NORCO/VICODIN) 5-325 MG per tablet Take 1 tablet by mouth every 4 (four) hours as needed. 10/03/13   Kathie Dike, PA-C  HYDROcodone-acetaminophen (NORCO/VICODIN) 5-325 MG per tablet Take 1 tablet by mouth every 4 (four) hours as needed. 10/31/13  Hope Orlene Och, NP  naproxen (NAPROSYN) 500 MG tablet Take 1 tablet (500 mg total) by mouth 2 (two) times daily. 10/31/13   Hope Orlene Och, NP   BP 142/73  Pulse 86  Temp(Src) 99.1 F (37.3 C)  Resp 20  Ht  (1.626 m)  Wt 150 lb (68.04 kg)  BMI 25.73 kg/m2  SpO2 100%  LMP 12/05/2013 Physical Exam  Nursing note and vitals reviewed. Constitutional: She is oriented to person, place, and time. She appears well-developed and well-nourished.  HENT:  Head: Normocephalic and atraumatic.  Eyes: Conjunctivae are normal.  Neck: Normal range of motion. Neck supple.  Pulmonary/Chest: Effort normal.  Musculoskeletal: Normal range of motion.  Neurological: She is alert and  oriented to person, place, and time. No cranial nerve deficit. Coordination normal.  Sciatic notch tendernes on left side, equal grip strength 5/5 in bilat lateral hands, equal strength in legs 5/5, moving all extremities without problems:full rom to left shoulder  Skin: Skin is warm and dry.  Psychiatric: She has a normal mood and affect.    ED Course  Procedures (including critical care time) Labs Review Labs Reviewed - No data to display  Imaging Review No results found.   EKG Interpretation None     DIAGNOSTIC STUDIES: Oxygen Saturation is 100% on room air, normal by my interpretation.    COORDINATION OF CARE: 11:12 AM Discussed treatment plan with patient at beside, the patient agrees with the plan and has no further questions at this time.    MDM   Final diagnoses:  Left low back pain, with sciatica presence unspecified  Left shoulder pain   Pt not exhibiting any neuro deficits at this time. Think she likely exacerbated chronic symptoms. Will treat symptomatically with pain medication and muscle relaxer. Don't think imaging is needed at this time   I personally performed the services described in this documentation, which was scribed in my presence. The recorded information has been reviewed and is accurate.      Teressa Lower, NP 12/25/13 1122

## 2013-12-25 NOTE — ED Notes (Signed)
Pt has already been seen and ready for d/c when seen by me. Alert. NAD

## 2013-12-25 NOTE — Discharge Instructions (Signed)
Back Pain, Adult Low back pain is very common. About 1 in 5 people have back pain.The cause of low back pain is rarely dangerous. The pain often gets better over time.About half of people with a sudden onset of back pain feel better in just 2 weeks. About 8 in 10 people feel better by 6 weeks.  CAUSES Some common causes of back pain include:  Strain of the muscles or ligaments supporting the spine.  Wear and tear (degeneration) of the spinal discs.  Arthritis.  Direct injury to the back. DIAGNOSIS Most of the time, the direct cause of low back pain is not known.However, back pain can be treated effectively even when the exact cause of the pain is unknown.Answering your caregiver's questions about your overall health and symptoms is one of the most accurate ways to make sure the cause of your pain is not dangerous. If your caregiver needs more information, he or she may order lab work or imaging tests (X-rays or MRIs).However, even if imaging tests show changes in your back, this usually does not require surgery. HOME CARE INSTRUCTIONS For many people, back pain returns.Since low back pain is rarely dangerous, it is often a condition that people can learn to manageon their own.   Remain active. It is stressful on the back to sit or stand in one place. Do not sit, drive, or stand in one place for more than 30 minutes at a time. Take short walks on level surfaces as soon as pain allows.Try to increase the length of time you walk each day.  Do not stay in bed.Resting more than 1 or 2 days can delay your recovery.  Do not avoid exercise or work.Your body is made to move.It is not dangerous to be active, even though your back may hurt.Your back will likely heal faster if you return to being active before your pain is gone.  Pay attention to your body when you bend and lift. Many people have less discomfortwhen lifting if they bend their knees, keep the load close to their bodies,and  avoid twisting. Often, the most comfortable positions are those that put less stress on your recovering back.  Find a comfortable position to sleep. Use a firm mattress and lie on your side with your knees slightly bent. If you lie on your back, put a pillow under your knees.  Only take over-the-counter or prescription medicines as directed by your caregiver. Over-the-counter medicines to reduce pain and inflammation are often the most helpful.Your caregiver may prescribe muscle relaxant drugs.These medicines help dull your pain so you can more quickly return to your normal activities and healthy exercise.  Put ice on the injured area.  Put ice in a plastic bag.  Place a towel between your skin and the bag.  Leave the ice on for 15-20 minutes, 03-04 times a day for the first 2 to 3 days. After that, ice and heat may be alternated to reduce pain and spasms.  Ask your caregiver about trying back exercises and gentle massage. This may be of some benefit.  Avoid feeling anxious or stressed.Stress increases muscle tension and can worsen back pain.It is important to recognize when you are anxious or stressed and learn ways to manage it.Exercise is a great option. SEEK MEDICAL CARE IF:  You have pain that is not relieved with rest or medicine.  You have pain that does not improve in 1 week.  You have new symptoms.  You are generally not feeling well. SEEK   IMMEDIATE MEDICAL CARE IF:   You have pain that radiates from your back into your legs.  You develop new bowel or bladder control problems.  You have unusual weakness or numbness in your arms or legs.  You develop nausea or vomiting.  You develop abdominal pain.  You feel faint. Document Released: 03/23/2005 Document Revised: 09/22/2011 Document Reviewed: 07/25/2013 Exit[employer] Patient Information 2015 [employer]. This information is not intended to replace advice given to you by your health care provider. Make sure you  discuss any questions you have with your health care provider.  

## 2013-12-25 NOTE — ED Notes (Signed)
Pt reports fell off of bottom step of ladder on Saturday. Pt reports increased back pain and shoulder pain. Pt reports has chronic back and shoulder pain but reports "has never flared-up this bad." nad noted. Pt denies LOC or hitting head.

## 2013-12-26 NOTE — ED Provider Notes (Signed)
Medical screening examination/treatment/procedure(s) were performed by non-physician practitioner and as supervising physician I was immediately available for consultation/collaboration.   EKG Interpretation None        Nabria Nevin L Harlan Ervine, MD 12/26/13 1252 

## 2014-02-05 ENCOUNTER — Encounter (HOSPITAL_COMMUNITY): Payer: Self-pay | Admitting: Emergency Medicine

## 2014-07-08 IMAGING — CR DG FOOT COMPLETE 3+V*L*
3 series · 3 of 3 positions shown · non-contrast
Comparison: None.

CLINICAL DATA: Foot pain

EXAM:
LEFT FOOT - COMPLETE 3+ VIEW

[view not recorded (1 of 3)]
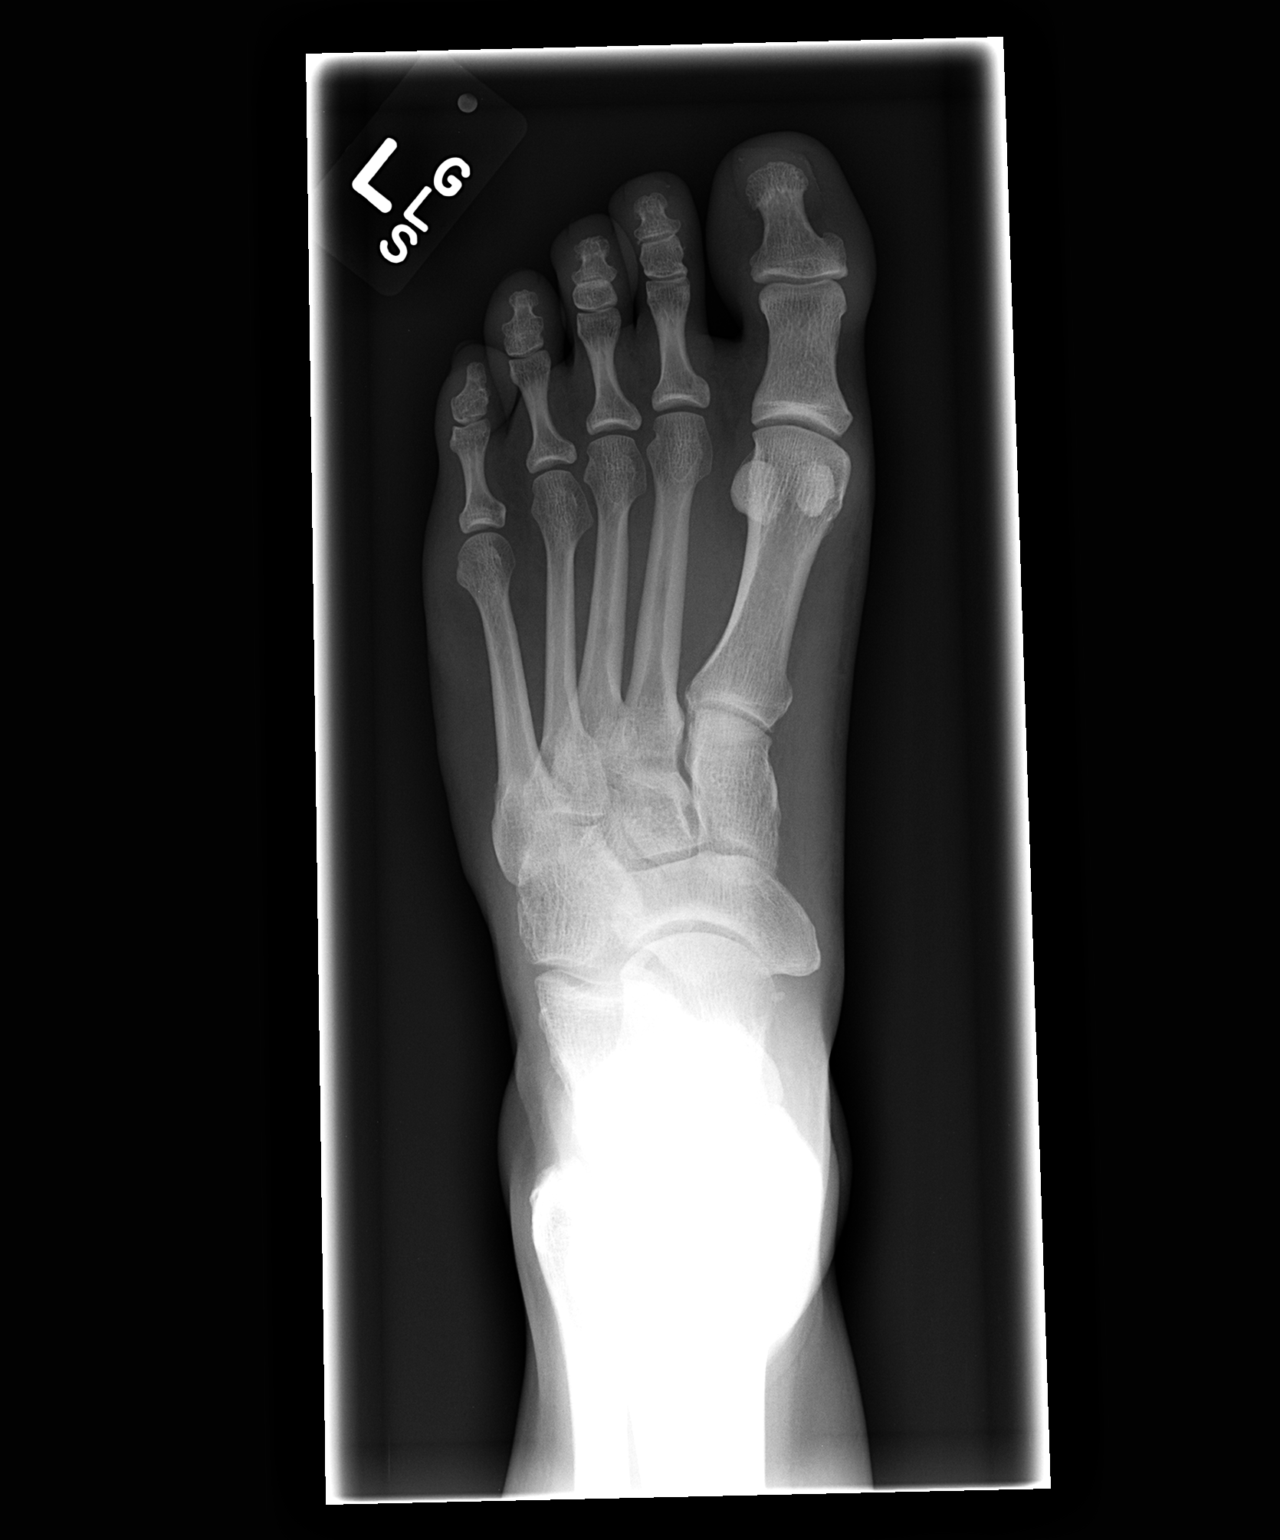

[view not recorded (2 of 3)]
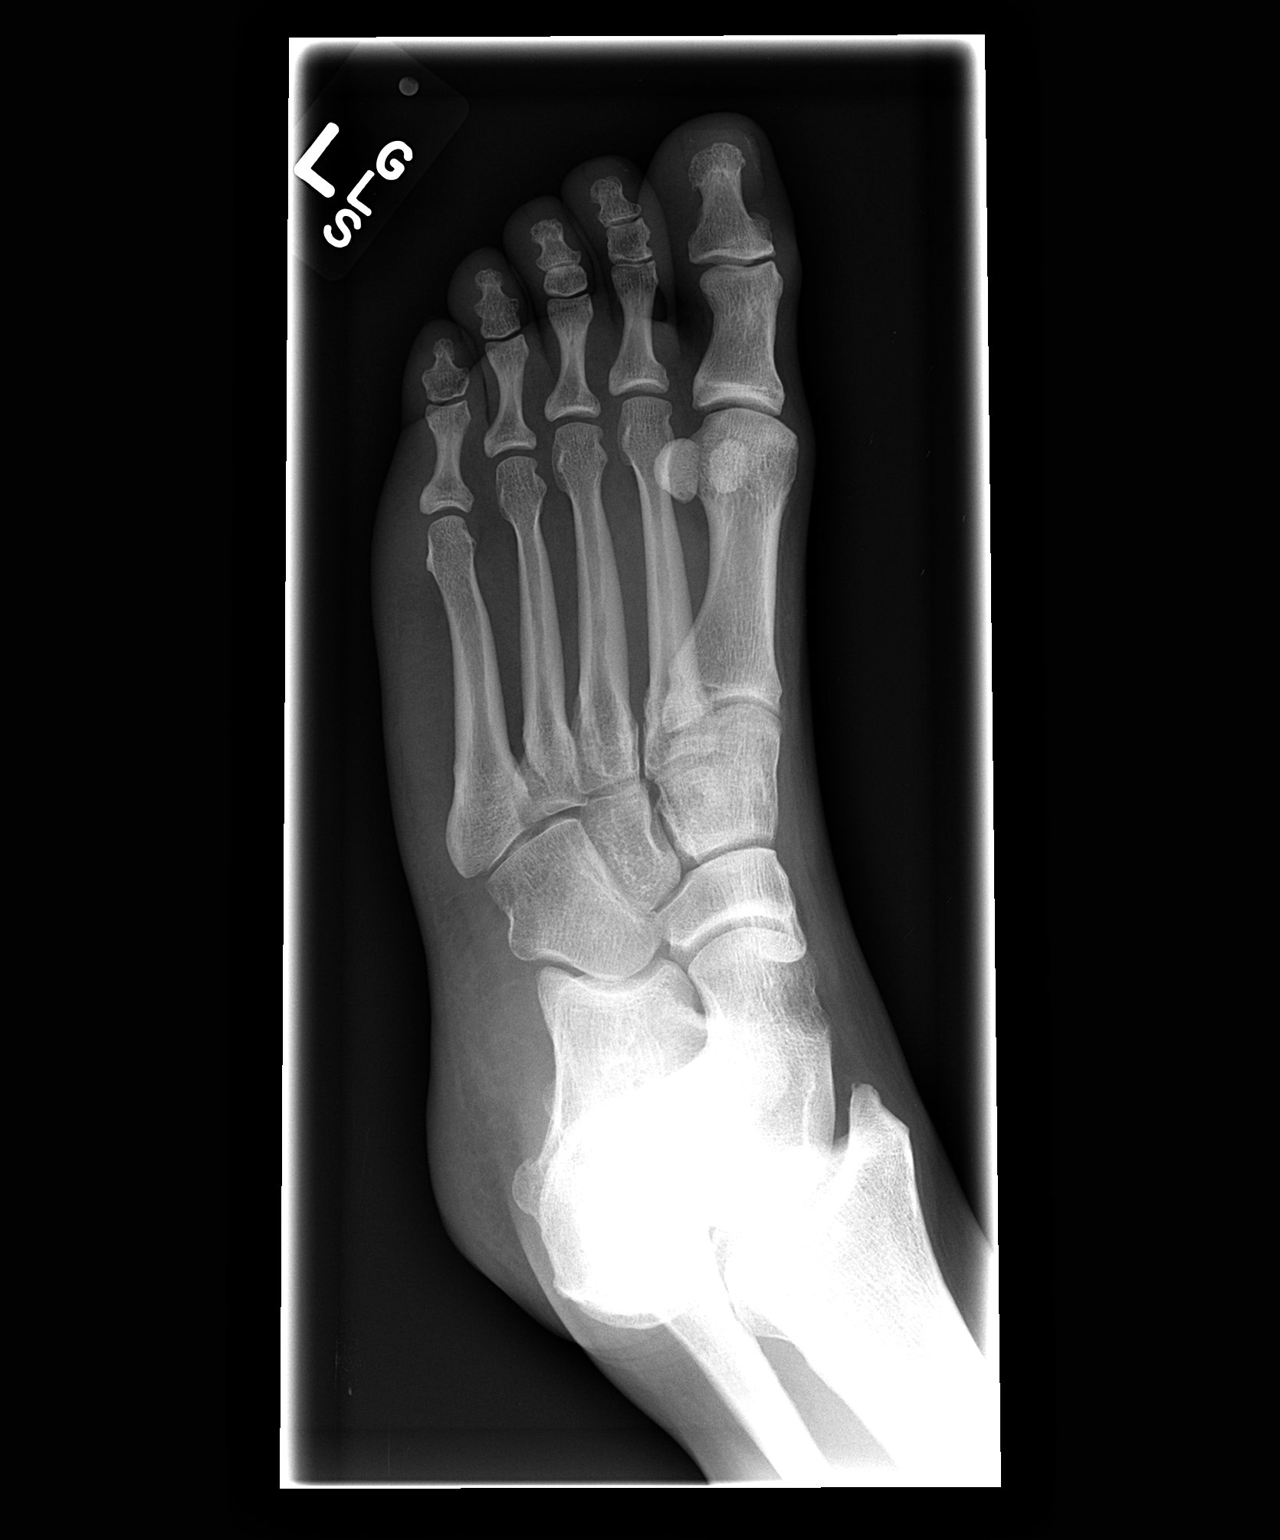

[view not recorded (3 of 3)]
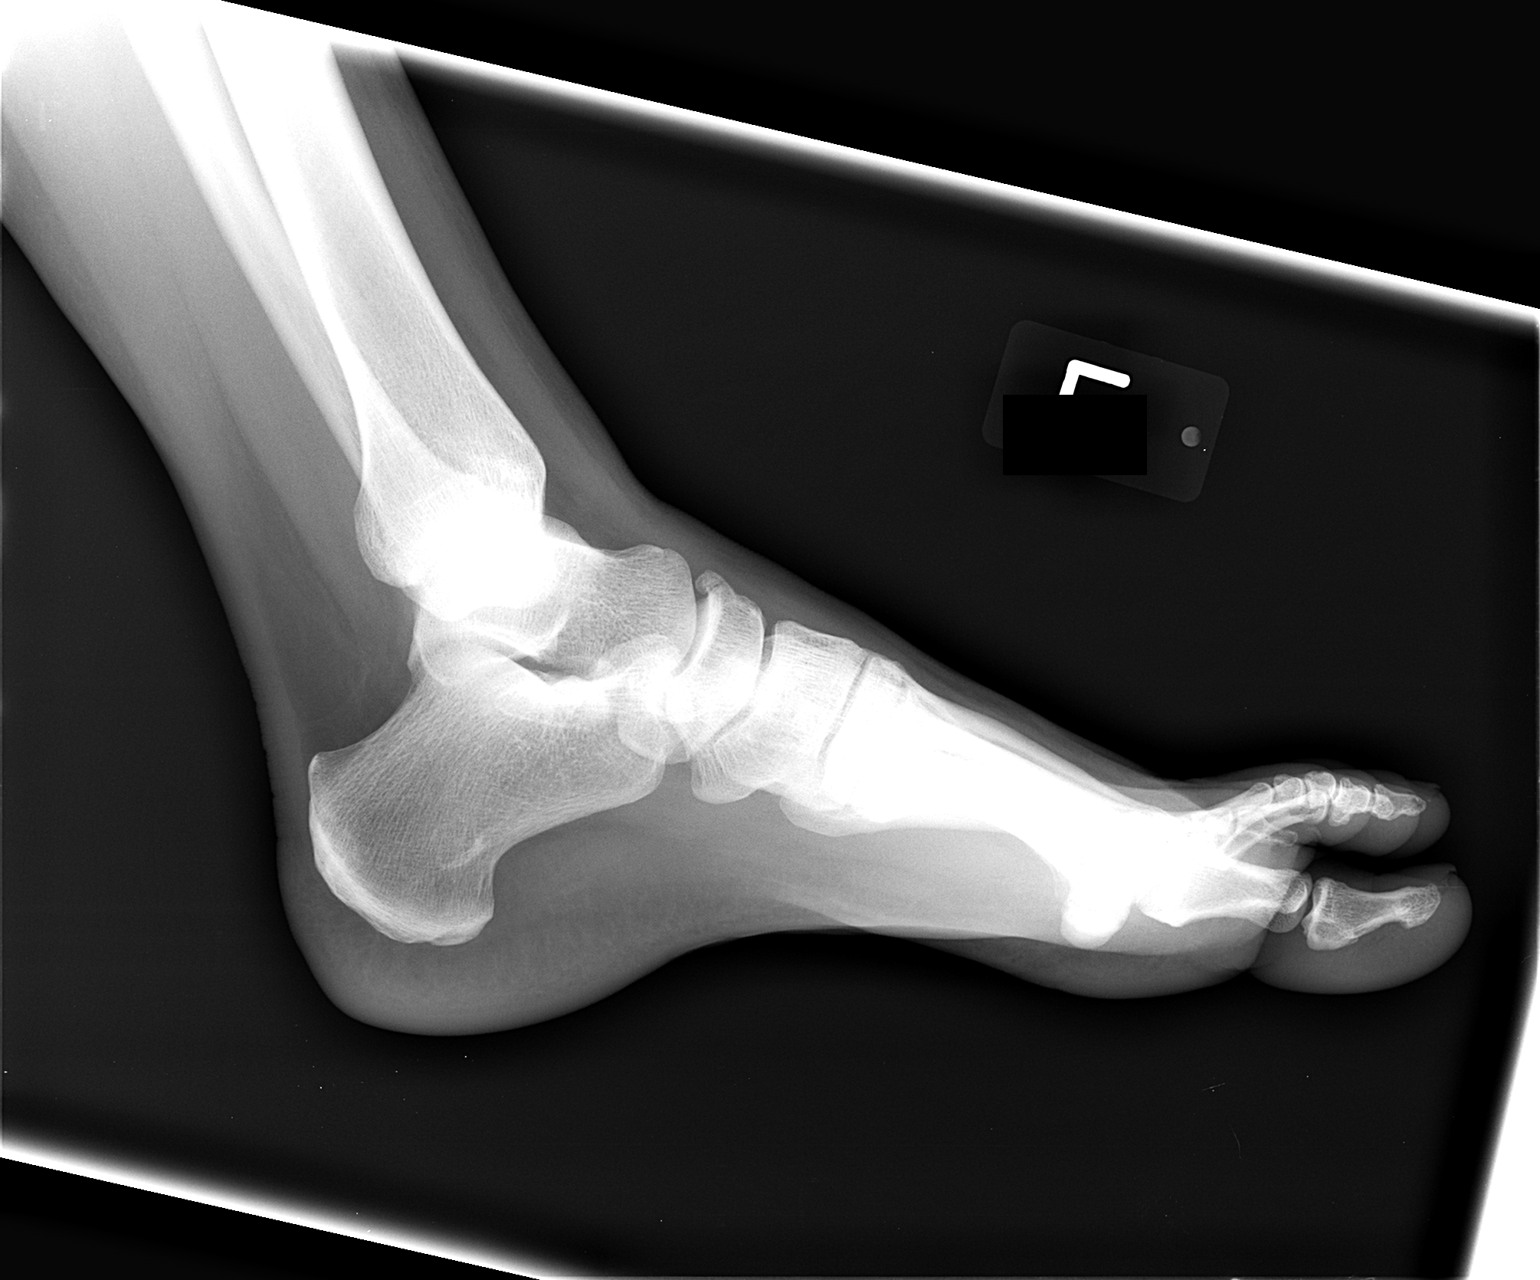

[3 of 3 positions shown; findings below may reference images not displayed]

FINDINGS: There is lucency through the dorsal aspect of the navicular
worrisome for a nondisplaced fracture. This involves a small sub cm
bone fragment which may represent spurring. Bony framework is
otherwise intact. Soft tissues are unremarkable.
IMPRESSION: Nondisplaced fracture of the navicular as described above.

## 2014-09-30 ENCOUNTER — Emergency Department (HOSPITAL_COMMUNITY)
Admission: EM | Admit: 2014-09-30 | Discharge: 2014-09-30 | Disposition: A | Discharge status: Home / Self Care | Payer: Self-pay | Attending: Emergency Medicine | Admitting: Emergency Medicine

## 2014-09-30 ENCOUNTER — Encounter (HOSPITAL_COMMUNITY): Payer: Self-pay | Admitting: Emergency Medicine

## 2014-09-30 DIAGNOSIS — Z880 Allergy status to penicillin: Secondary | ICD-10-CM | POA: Insufficient documentation

## 2014-09-30 DIAGNOSIS — G8929 Other chronic pain: Secondary | ICD-10-CM | POA: Insufficient documentation

## 2014-09-30 DIAGNOSIS — Z720 Tobacco use: Secondary | ICD-10-CM | POA: Insufficient documentation

## 2014-09-30 DIAGNOSIS — M1990 Unspecified osteoarthritis, unspecified site: Secondary | ICD-10-CM | POA: Insufficient documentation

## 2014-09-30 DIAGNOSIS — Z91040 Latex allergy status: Secondary | ICD-10-CM | POA: Insufficient documentation

## 2014-09-30 DIAGNOSIS — Z791 Long term (current) use of non-steroidal anti-inflammatories (NSAID): Secondary | ICD-10-CM | POA: Insufficient documentation

## 2014-09-30 DIAGNOSIS — M5431 Sciatica, right side: Secondary | ICD-10-CM | POA: Insufficient documentation

## 2014-09-30 DIAGNOSIS — Z8719 Personal history of other diseases of the digestive system: Secondary | ICD-10-CM | POA: Insufficient documentation

## 2014-09-30 DIAGNOSIS — Z8669 Personal history of other diseases of the nervous system and sense organs: Secondary | ICD-10-CM | POA: Insufficient documentation

## 2014-09-30 MED ORDER — HYDROCODONE-ACETAMINOPHEN 5-325 MG PO TABS: 1.0000 | ORAL_TABLET | ORAL | Status: DC | PRN

## 2014-09-30 MED ORDER — CYCLOBENZAPRINE HCL 5 MG PO TABS: 5.0000 mg | ORAL_TABLET | Freq: Three times a day (TID) | ORAL | Status: DC | PRN

## 2014-09-30 MED ORDER — NAPROXEN 500 MG PO TABS
500.0000 mg | ORAL_TABLET | Freq: Two times a day (BID) | ORAL | Status: DC
Start: 2014-09-30 — End: 2015-01-12

## 2014-09-30 NOTE — Discharge Instructions (Signed)
Sciatica Sciatica is pain, weakness, numbness, or tingling along the path of the sciatic nerve. The nerve starts in the lower back and runs down the back of each leg. The nerve controls the muscles in the lower leg and in the back of the knee, while also providing sensation to the back of the thigh, lower leg, and the sole of your foot. Sciatica is a symptom of another medical condition. For instance, nerve damage or certain conditions, such as a herniated disk or bone spur on the spine, pinch or put pressure on the sciatic nerve. This causes the pain, weakness, or other sensations normally associated with sciatica. Generally, sciatica only affects one side of the body. CAUSES   Herniated or slipped disc.  Degenerative disk disease.  A pain disorder involving the narrow muscle in the buttocks (piriformis syndrome).  Pelvic injury or fracture.  Pregnancy.  Tumor (rare). SYMPTOMS  Symptoms can vary from mild to very severe. The symptoms usually travel from the low back to the buttocks and down the back of the leg. Symptoms can include:  Mild tingling or dull aches in the lower back, leg, or hip.  Numbness in the back of the calf or sole of the foot.  Burning sensations in the lower back, leg, or hip.  Sharp pains in the lower back, leg, or hip.  Leg weakness.  Severe back pain inhibiting movement. These symptoms may get worse with coughing, sneezing, laughing, or prolonged sitting or standing. Also, being overweight may worsen symptoms. DIAGNOSIS  Your caregiver will perform a physical exam to look for common symptoms of sciatica. He or she may ask you to do certain movements or activities that would trigger sciatic nerve pain. Other tests may be performed to find the cause of the sciatica. These may include:  Blood tests.  X-rays.  Imaging tests, such as an MRI or CT scan. TREATMENT  Treatment is directed at the cause of the sciatic pain. Sometimes, treatment is not necessary  and the pain and discomfort goes away on its own. If treatment is needed, your caregiver may suggest:  Over-the-counter medicines to relieve pain.  Prescription medicines, such as anti-inflammatory medicine, muscle relaxants, or narcotics.  Applying heat or ice to the painful area.  Steroid injections to lessen pain, irritation, and inflammation around the nerve.  Reducing activity during periods of pain.  Exercising and stretching to strengthen your abdomen and improve flexibility of your spine. Your caregiver may suggest losing weight if the extra weight makes the back pain worse.  Physical therapy.  Surgery to eliminate what is pressing or pinching the nerve, such as a bone spur or part of a herniated disk. HOME CARE INSTRUCTIONS   Only take over-the-counter or prescription medicines for pain or discomfort as directed by your caregiver.  Apply ice to the affected area for 20 minutes, 3-4 times a day for the first 48-72 hours. Then try heat in the same way.  Exercise, stretch, or perform your usual activities if these do not aggravate your pain.  Attend physical therapy sessions as directed by your caregiver.  Keep all follow-up appointments as directed by your caregiver.  Do not wear high heels or shoes that do not provide proper support.  Check your mattress to see if it is too soft. A firm mattress may lessen your pain and discomfort. SEEK IMMEDIATE MEDICAL CARE IF:   You lose control of your bowel or bladder (incontinence).  You have increasing weakness in the lower back, pelvis, buttocks,   or legs.  You have redness or swelling of your back.  You have a burning sensation when you urinate.  You have pain that gets worse when you lie down or awakens you at night.  Your pain is worse than you have experienced in the past.  Your pain is lasting longer than 4 weeks.  You are suddenly losing weight without reason. MAKE SURE YOU:  Understand these  instructions.  Will watch your condition.  Will get help right away if you are not doing well or get worse. Document Released: 03/17/2001 Document Revised: 09/22/2011 Document Reviewed: 08/02/2011 Exit[employer] Patient Information 2015 [employer]. This information is not intended to replace advice given to you by your health care provider. Make sure you discuss any questions you have with your health care provider.  

## 2014-09-30 NOTE — ED Notes (Signed)
Patient c/o lower back pain with back spasm. Patient reports hx of back pain in which she thinks she has aggravated back in past week with driving and taking care of child. Per patient pain radiates down right leg to ankle and radaites down left leg to knee. CNS intact. Patient ambulated to triage.

## 2014-09-30 NOTE — ED Provider Notes (Signed)
CSN: [id number]     Arrival date & time 09/30/14  1114 History  This chart was scribed for non-physician practitioner, Burgess Amor, PA-C, working with Layla Maw Ward, DO, by Roxy Cedar ED Scribe. This patient was seen in room APFT22/APFT22 and the patient's care was started at 12:47 PM    Chief Complaint  Patient presents with  . Back Pain   Patient is a 44 y.o. female presenting with back pain. The history is provided by the patient. No language interpreter was used.  Back Pain Location:  Lumbar spine Quality:  Aching Radiates to:  R knee and R posterior upper leg (right ankle) Pain severity:  Moderate Onset quality:  Gradual Duration:  2 days Timing:  Constant Chronicity:  Recurrent Relieved by:  Nothing Worsened by:  Nothing tried Ineffective treatments: ibuprofen. Associated symptoms: no abdominal pain, no chest pain, no dysuria, no fever, no numbness and no weakness    HPI Comments: Lisa Mcclain is a 44 y.o. female with a PMHx of chronic back pain, arthritis, sciatica and spinal tenosis, who presents to the Emergency Department complaining of moderate lower back pain that began 2 days ago after increased amounts of driving and taking care of a relatives toddler while they moved. She states her pain radiates down her right leg to her ankle. She states that when she usually feels back pain, it radiates down to her right knee, but not as far as her ankle at baseline. She denies associated numbness, weakness or bladder incontinence. She states she has taken ibuprofen without relief.  She is not currently seen by a PCP.   Past Medical History  Diagnosis Date  . Celiac disease   . Back pain   . Tachycardia   . Nerve compression   . Arthritis   . Degeneration of cervical intervertebral disc   . Sciatica    Past Surgical History  Procedure Laterality Date  . I&d extremity  04/20/2012    Procedure: IRRIGATION AND DEBRIDEMENT EXTREMITY;  Surgeon: Marlowe Shores, MD;   Location: [facility];  Service: Orthopedics;  Laterality: Left;   Family History  Problem Relation Age of Onset  . Cancer Other    History  Substance Use Topics  . Smoking status: Current Every Day Smoker -- 1.00 packs/day for 28 years    Types: Cigarettes  . Smokeless tobacco: Never Used  . Alcohol Use: Yes     Comment: occasional use   OB History    Gravida Para Term Preterm AB TAB SAB Ectopic Multiple Living   3 2   1  1   2      Review of Systems  Constitutional: Negative for fever.  Respiratory: Negative for shortness of breath.   Cardiovascular: Negative for chest pain and leg swelling.  Gastrointestinal: Negative for abdominal pain, constipation and abdominal distention.  Genitourinary: Negative for dysuria, urgency, frequency, flank pain and difficulty urinating.  Musculoskeletal: Positive for back pain. Negative for joint swelling and gait problem.  Skin: Negative for rash.  Neurological: Negative for weakness and numbness.   Allergies  Cephalexin; Erythromycin; Gluten; Tramadol; Wheat; Latex; and Penicillins  Home Medications   Prior to Admission medications   Medication Sig Start Date End Date Taking? Authorizing Provider  acetaminophen (TYLENOL) 500 MG tablet Take 1,000 mg by mouth every 8 (eight) hours as needed for moderate pain.    Historical Provider, MD  cyclobenzaprine (FLEXERIL) 5 MG tablet Take 1 tablet (5 mg total) by mouth 3 (three)  times daily as needed for muscle spasms. 09/30/14   Burgess Amor, PA-C  diazepam (VALIUM) 5 MG tablet Take 1 tablet (5 mg total) by mouth 3 (three) times daily. 10/03/13   Ivery Quale, PA-C  diclofenac (VOLTAREN) 75 MG EC tablet Take 1 tablet (75 mg total) by mouth 2 (two) times daily. 10/03/13   Ivery Quale, PA-C  HYDROcodone-acetaminophen (NORCO/VICODIN) 5-325 MG per tablet Take 1 tablet by mouth every 4 (four) hours as needed. 09/30/14   Burgess Amor, PA-C  naproxen (NAPROSYN) 500 MG tablet Take 1 tablet (500 mg  total) by mouth 2 (two) times daily. 09/30/14   Burgess Amor, PA-C   Triage Vitals: BP 110/62 mmHg  Pulse 99  Temp(Src) 97.7 F (36.5 C) (Oral)  Resp 16  Ht  (1.626 m)  Wt 150 lb (68.04 kg)  BMI 25.73 kg/m2  SpO2 100%  LMP 09/23/2014  Physical Exam  Constitutional: She appears well-developed and well-nourished.  HENT:  Head: Normocephalic.  Eyes: Conjunctivae are normal.  Neck: Normal range of motion. Neck supple.  Cardiovascular: Normal rate and intact distal pulses.   Pedal pulses normal.  Pulmonary/Chest: Effort normal.  Abdominal: Soft. Bowel sounds are normal. She exhibits no distension and no mass.  Musculoskeletal: Normal range of motion. She exhibits no edema.       Lumbar back: She exhibits tenderness. She exhibits no swelling, no edema and no spasm.  More TTP to right paralumbar area. No midline tenderness noted.  Neurological: She is alert. She has normal strength. She displays no atrophy and no tremor. No sensory deficit. Gait normal.  Reflex Scores:      Patellar reflexes are 2+ on the right side and 2+ on the left side.      Achilles reflexes are 2+ on the right side and 2+ on the left side. No strength deficit noted in hip and knee flexor and extensor muscle groups.  Ankle flexion and extension intact.  Skin: Skin is warm and dry.  Psychiatric: She has a normal mood and affect.  Nursing note and vitals reviewed.  ED Course  Procedures (including critical care time)  DIAGNOSTIC STUDIES: Oxygen Saturation is 100% on RA, normal by my interpretation.    COORDINATION OF CARE: 12:53 PM- Discussed plans to give patient Norco/Vicodin, Naprosyn and Flexeril for pain management. Pt advised of plan for treatment and pt agrees.  Labs Review Labs Reviewed - No data to display  Imaging Review No results found.   EKG Interpretation None     MDM   Final diagnoses:  Sciatica, right    No neuro deficit on exam or by history to suggest emergent or surgical  presentation.  Also discussed worsened sx that should prompt immediate re-evaluation including distal weakness, bowel/bladder retention/incontinence.   I personally performed the services described in this documentation, which was scribed in my presence. The recorded information has been reviewed and is accurate.       Burgess Amor, PA-C 10/02/14 1034  Kristen N Ward, DO 10/03/14 [phone]

## 2015-01-12 ENCOUNTER — Emergency Department (HOSPITAL_COMMUNITY)
Admission: EM | Admit: 2015-01-12 | Discharge: 2015-01-12 | Disposition: A | Discharge status: Home / Self Care | Payer: Self-pay | Attending: Emergency Medicine | Admitting: Emergency Medicine

## 2015-01-12 ENCOUNTER — Encounter (HOSPITAL_COMMUNITY): Payer: Self-pay | Admitting: *Deleted

## 2015-01-12 DIAGNOSIS — Z720 Tobacco use: Secondary | ICD-10-CM | POA: Insufficient documentation

## 2015-01-12 DIAGNOSIS — M1990 Unspecified osteoarthritis, unspecified site: Secondary | ICD-10-CM | POA: Insufficient documentation

## 2015-01-12 DIAGNOSIS — G8929 Other chronic pain: Secondary | ICD-10-CM

## 2015-01-12 DIAGNOSIS — Z8719 Personal history of other diseases of the digestive system: Secondary | ICD-10-CM | POA: Insufficient documentation

## 2015-01-12 DIAGNOSIS — M545 Low back pain: Secondary | ICD-10-CM | POA: Insufficient documentation

## 2015-01-12 DIAGNOSIS — M62830 Muscle spasm of back: Secondary | ICD-10-CM | POA: Insufficient documentation

## 2015-01-12 MED ORDER — DIAZEPAM 5 MG/ML IJ SOLN
5.0000 mg | Freq: Once | INTRAMUSCULAR | Status: AC
  Administered 2015-01-12: 5 mg via INTRAMUSCULAR
  Filled 2015-01-12: qty 2

## 2015-01-12 MED ORDER — PREDNISONE 20 MG PO TABS: ORAL_TABLET | ORAL | Status: DC

## 2015-01-12 MED ORDER — CYCLOBENZAPRINE HCL 10 MG PO TABS: 10.0000 mg | ORAL_TABLET | Freq: Three times a day (TID) | ORAL | Status: DC | PRN

## 2015-01-12 MED ORDER — DEXAMETHASONE SODIUM PHOSPHATE 10 MG/ML IJ SOLN
10.0000 mg | Freq: Once | INTRAMUSCULAR | Status: AC
  Administered 2015-01-12: 10 mg via INTRAMUSCULAR
  Filled 2015-01-12: qty 1

## 2015-01-12 MED ORDER — NAPROXEN 500 MG PO TABS: ORAL_TABLET | ORAL | Status: DC

## 2015-01-12 NOTE — ED Notes (Signed)
Pt's family member out at the desk requesting for the patient to have something for pain, he stated pt is in room crying about the pain; Dr. Lynelle Doctor informed and stated pt can not have any other medications, that the muscle relaxer and steroid will take a couple of hours before the pt may start to feel better; pt informed of what the dr said and pt became upset stating "I have never not received relief from [facility]"; pt refused to sign and seen walking out with assistance from pt's family member.

## 2015-01-12 NOTE — ED Notes (Signed)
p c/o lower back pain that radiates down both legs x 2 days.

## 2015-01-12 NOTE — Discharge Instructions (Signed)
Use ice packs to help numb the pain in your back and use heat to help relax the muscles. Take the prednisone until gone. When the prednisone is gone you can start the naproxen. Take it with food so it won't upset your stomach. Take the Flexeril for your muscle spasms. Once you are improving start the back exercises slowly to strengthen your back muscles and your abdominal wall muscles. If you are not improving you can be followed up by Dr. Ronnald Ramp, he is a neurosurgeon in [place].  Back Injury Prevention Back injuries can be very painful. They can also be difficult to heal. After having one back injury, you are more likely to injure your back again. It is important to learn how to avoid injuring or re-injuring your back. The following tips can help you to prevent a back injury. WHAT SHOULD I KNOW ABOUT PHYSICAL FITNESS?  Exercise for 30 minutes per day on most days of the week or as told by your doctor. Make sure to:  Do aerobic exercises, such as walking, jogging, biking, or swimming.  Do exercises that increase balance and strength, such as tai chi and yoga.  Do stretching exercises. This helps with flexibility.  Try to develop strong belly (abdominal) muscles. Your belly muscles help to support your back.  Stay at a healthy weight. This helps to decrease your risk of a back injury. WHAT SHOULD I KNOW ABOUT MY DIET?  Talk with your doctor about your overall diet. Take supplements and vitamins only as told by your doctor.  Talk with your doctor about how much calcium and vitamin D you need each day. These nutrients help to prevent weakening of the bones (osteoporosis).  Include good sources of calcium in your diet, such as:  Dairy products.  Green leafy vegetables.  Products that have had calcium added to them (fortified).  Include good sources of vitamin D in your diet, such as:  Milk.  Foods that have had vitamin D added to them. WHAT SHOULD I KNOW ABOUT MY POSTURE?  Sit up  straight and stand up straight. Avoid leaning forward when you sit or hunching over when you stand.  Choose chairs that have good low-back (lumbar) support.  If you work at a desk, sit close to it so you do not need to lean over. Keep your chin tucked in. Keep your neck drawn back. Keep your elbows bent so your arms look like the letter "L" (right angle).  Sit high and close to the steering wheel when you drive. Add a low-back support to your car seat, if needed.  Avoid sitting or standing in one position for very long. Take breaks to get up, stretch, and walk around at least one time every hour. Take breaks every hour if you are driving for long periods of time.  Sleep on your side with your knees slightly bent, or sleep on your back with a pillow under your knees. Do not lie on the front of your body to sleep. WHAT SHOULD I KNOW ABOUT LIFTING, TWISTING, AND REACHING Lifting and Heavy Lifting  Avoid heavy lifting, especially lifting over and over again. If you must do heavy lifting:  Stretch before lifting.  Work slowly.  Rest between lifts.  Use a tool such as a cart or a dolly to move objects if one is available.  Make several small trips instead of carrying one heavy load.  Ask for help when you need it, especially when moving big objects.  Follow these  steps when lifting:  Stand with your feet shoulder-width apart.  Get as close to the object as you can. Do not pick up a heavy object that is far from your body.  Use handles or lifting straps if they are available.  Bend at your knees. Squat down, but keep your heels off the floor.  Keep your shoulders back. Keep your chin tucked in. Keep your back straight.  Lift the object slowly while you tighten the muscles in your legs, belly, and butt. Keep the object as close to the center of your body as possible.  Follow these steps when putting down a heavy load:  Stand with your feet shoulder-width apart.  Lower the  object slowly while you tighten the muscles in your legs, belly, and butt. Keep the object as close to the center of your body as possible.  Keep your shoulders back. Keep your chin tucked in. Keep your back straight.  Bend at your knees. Squat down, but keep your heels off the floor.  Use handles or lifting straps if they are available. Twisting and Reaching 1. Avoid lifting heavy objects above your waist. 2. Do not twist at your waist while you are lifting or carrying a load. If you need to turn, move your feet. 3. Do not bend over without bending at your knees. 4. Avoid reaching over your head, across a table, or for an object on a high surface.  WHAT ARE SOME OTHER TIPS? 1. Avoid wet floors and icy ground. Keep sidewalks clear of ice to prevent falls.  2. Do not sleep on a mattress that is too soft or too hard.  3. Keep items that you use often within easy reach.  4. Put heavier objects on shelves at waist level, and put lighter objects on lower or higher shelves. 5. Find ways to lower your stress, such as: 1. Exercise. 2. Massage. 3. Relaxation techniques. 6. Talk with your doctor if you feel anxious or depressed. These conditions can make back pain worse. 7. Wear flat heel shoes with cushioned soles. 8. Avoid making quick (sudden) movements. 9. Use both shoulder straps when carrying a backpack. 10. Do not use any tobacco products, including cigarettes, chewing tobacco, or electronic cigarettes. If you need help quitting, ask your doctor.   This information is not intended to replace advice given to you by your health care provider. Make sure you discuss any questions you have with your health care provider.   Document Released: 09/09/2007 Document Revised: 08/07/2014 Document Reviewed: 03/27/2014 Elsevier Interactive Patient Education 2016 [place].  Back Exercises If you have pain in your back, do these exercises 2-3 times each day or as told by your doctor. When the  pain goes away, do the exercises once each day, but repeat the steps more times for each exercise (do more repetitions). If you do not have pain in your back, do these exercises once each day or as told by your doctor. EXERCISES Single Knee to Chest Do these steps 3-5 times in a row for each leg:  Lie on your back on a firm bed or the floor with your legs stretched out.  Bring one knee to your chest.  Hold your knee to your chest by grabbing your knee or thigh.  Pull on your knee until you feel a gentle stretch in your lower back.  Keep doing the stretch for 10-30 seconds.  Slowly let go of your leg and straighten it. Pelvic Tilt Do these steps 5-10  times in a row:  Lie on your back on a firm bed or the floor with your legs stretched out.  Bend your knees so they point up to the ceiling. Your feet should be flat on the floor.  Tighten your lower belly (abdomen) muscles to press your lower back against the floor. This will make your tailbone point up to the ceiling instead of pointing down to your feet or the floor.  Stay in this position for 5-10 seconds while you gently tighten your muscles and breathe evenly. Cat-Cow Do these steps until your lower back bends more easily:  Get on your hands and knees on a firm surface. Keep your hands under your shoulders, and keep your knees under your hips. You may put padding under your knees.  Let your head hang down, and make your tailbone point down to the floor so your lower back is round like the back of a cat.  Stay in this position for 5 seconds.  Slowly lift your head and make your tailbone point up to the ceiling so your back hangs low (sags) like the back of a cow.  Stay in this position for 5 seconds. Press-Ups Do these steps 5-10 times in a row:  Lie on your belly (face-down) on the floor.  Place your hands near your head, about shoulder-width apart.  While you keep your back relaxed and keep your hips on the floor,  slowly straighten your arms to raise the top half of your body and lift your shoulders. Do not use your back muscles. To make yourself more comfortable, you may change where you place your hands.  Stay in this position for 5 seconds.  Slowly return to lying flat on the floor. Bridges Do these steps 10 times in a row: 5. Lie on your back on a firm surface. 6. Bend your knees so they point up to the ceiling. Your feet should be flat on the floor. 7. Tighten your butt muscles and lift your butt off of the floor until your waist is almost as high as your knees. If you do not feel the muscles working in your butt and the back of your thighs, slide your feet 1-2 inches farther away from your butt. 8. Stay in this position for 3-5 seconds. 9. Slowly lower your butt to the floor, and let your butt muscles relax. If this exercise is too easy, try doing it with your arms crossed over your chest. Belly Crunches Do these steps 5-10 times in a row: 11. Lie on your back on a firm bed or the floor with your legs stretched out. 12. Bend your knees so they point up to the ceiling. Your feet should be flat on the floor. 30. Cross your arms over your chest. 14. Tip your chin a little bit toward your chest but do not bend your neck. 74. Tighten your belly muscles and slowly raise your chest just enough to lift your shoulder blades a tiny bit off of the floor. 16. Slowly lower your chest and your head to the floor. Back Lifts Do these steps 5-10 times in a row: 1. Lie on your belly (face-down) with your arms at your sides, and rest your forehead on the floor. 2. Tighten the muscles in your legs and your butt. 3. Slowly lift your chest off of the floor while you keep your hips on the floor. Keep the back of your head in line with the curve in your back. Look at the  floor while you do this. 4. Stay in this position for 3-5 seconds. 5. Slowly lower your chest and your face to the floor. GET HELP IF:  Your back  pain gets a lot worse when you do an exercise.  Your back pain does not lessen 2 hours after you exercise. If you have any of these problems, stop doing the exercises. Do not do them again unless your doctor says it is okay. GET HELP RIGHT AWAY IF:  You have sudden, very bad back pain. If this happens, stop doing the exercises. Do not do them again unless your doctor says it is okay.   This information is not intended to replace advice given to you by your health care provider. Make sure you discuss any questions you have with your health care provider.   Document Released: 04/25/2010 Document Revised: 12/12/2014 Document Reviewed: 05/17/2014 Elsevier Interactive Patient Education 2016 [place] therapy can help ease sore, stiff, injured, and tight muscles and joints. Heat relaxes your muscles, which may help ease your pain. Heat therapy should only be used on old, pre-existing, or long-lasting (chronic) injuries. Do not use heat therapy unless told by your doctor. HOW TO USE HEAT THERAPY There are several different kinds of heat therapy, including:  Moist heat pack.  Warm water bath.  Hot water bottle.  Electric heating pad.  Heated gel pack.  Heated wrap.  Electric heating pad. GENERAL HEAT THERAPY RECOMMENDATIONS   Do not sleep while using heat therapy. Only use heat therapy while you are awake.  Your skin may turn pink while using heat therapy. Do not use heat therapy if your skin turns red.  Do not use heat therapy if you have new pain.  High heat or long exposure to heat can cause burns. Be careful when using heat therapy to avoid burning your skin.  Do not use heat therapy on areas of your skin that are already irritated, such as with a rash or sunburn. GET HELP IF:   You have blisters, redness, swelling (puffiness), or numbness.  You have new pain.  Your pain is worse. MAKE SURE YOU:  Understand these instructions.  Will watch your  condition.  Will get help right away if you are not doing well or get worse.   This information is not intended to replace advice given to you by your health care provider. Make sure you discuss any questions you have with your health care provider.   Document Released: 06/15/2011 Document Revised: 04/13/2014 Document Reviewed: 05/16/2013 Elsevier Interactive Patient Education [employer].

## 2015-01-12 NOTE — ED Provider Notes (Signed)
CSN: [id number]     Arrival date & time 01/12/15  4098 History   First MD Initiated Contact with Patient 01/12/15 0545     Chief Complaint  Patient presents with  . Back Pain     (Consider location/radiation/quality/duration/timing/severity/associated sxs/prior Treatment) HPI patient states she has had back pain "forever". She states 2 days ago she woke up from sleep about 4 AM and she was having increased lower sacral back pain radiating down both her legs into her feet. She has had pain radiating into her legs before. She describes a lot of back spasms. Any type of movement makes the pain worse. She states this is the pain she has had before. She denies any urinary or rectal incontinence.  PCP none  Past Medical History  Diagnosis Date  . Celiac disease   . Back pain   . Tachycardia   . Nerve compression   . Arthritis   . Degeneration of cervical intervertebral disc   . Sciatica    Past Surgical History  Procedure Laterality Date  . I&d extremity  04/20/2012    Procedure: IRRIGATION AND DEBRIDEMENT EXTREMITY;  Surgeon: Marlowe Shores, MD;  Location: [facility];  Service: Orthopedics;  Laterality: Left;   Family History  Problem Relation Age of Onset  . Cancer Other    Social History  Substance Use Topics  . Smoking status: Current Every Day Smoker -- 1.00 packs/day for 28 years    Types: Cigarettes  . Smokeless tobacco: Never Used  . Alcohol Use: Yes     Comment: occasional use   Unemployed Rare wine at dinner Smokes half pack per day Lives with spouse  OB History    Gravida Para Term Preterm AB TAB SAB Ectopic Multiple Living   Review of Systems  All other systems reviewed and are negative.     Allergies  Cephalexin; Erythromycin; Gluten; Tramadol; Wheat; Latex; and Penicillins  Home Medications   Prior to Admission medications   Medication Sig Start Date End Date Taking? Authorizing Provider  acetaminophen  (TYLENOL) 500 MG tablet Take 1,000 mg by mouth every 8 (eight) hours as needed for moderate pain.   Yes Historical Provider, MD  cyclobenzaprine (FLEXERIL) 10 MG tablet Take 1 tablet (10 mg total) by mouth 3 (three) times daily as needed (muscle soreness). 01/12/15   Devoria Albe, MD  naproxen (NAPROSYN) 500 MG tablet Take 1 po BID with food prn pain 01/12/15   Devoria Albe, MD  predniSONE (DELTASONE) 20 MG tablet Take 3 po QD x 3d , then 2 po QD x 3d then 1 po QD x 3d 01/12/15   Devoria Albe, MD   BP 113/85 mmHg  Pulse 103  Temp(Src) 98.1 F (36.7 C) (Oral)  Resp 20  Ht  (1.651 m)  Wt 150 lb (68.04 kg)  BMI 24.96 kg/m2  SpO2 100%  LMP 01/09/2015  Vital signs normal except for tachycardia   Physical Exam  Constitutional: She is oriented to person, place, and time. She appears well-developed and well-nourished.  Non-toxic appearance. She does not appear ill. She appears distressed.  Appears older than her stated age  HENT:  Head: Normocephalic and atraumatic.  Right Ear: External ear normal.  Left Ear: External ear normal.  Nose: Nose normal. No mucosal edema or rhinorrhea.  Mouth/Throat: Mucous membranes are normal. No dental abscesses or uvula swelling.  Eyes: Conjunctivae and EOM are  normal. Pupils are equal, round, and reactive to light.  Neck: Normal range of motion and full passive range of motion without pain. Neck supple.  Pulmonary/Chest: Effort normal. No respiratory distress. She has no rhonchi. She exhibits no crepitus.  Abdominal: Normal appearance.  Musculoskeletal: Normal range of motion. She exhibits tenderness. She exhibits no edema.  Moves all extremities well. Patient starts getting tenderness in her lower lumbar spine but she is most tender over her midline sacral area and very tender over her bilateral SI joints. She has minimal tenderness in the sciatic notches bilaterally. Patellar reflexes are 2+ and equal bilaterally. On range of motion to left causes the pain to  shoot down her left leg, on range of motion to the right causes the pain to shoot down her right leg, forward flexion is the worse with the most restriction of range of motion. Straight leg raising hurts in her lower back bilaterally although the left appears worse than the right.  Neurological: She is alert and oriented to person, place, and time. She has normal strength. No cranial nerve deficit.  Skin: Skin is warm, dry and intact. No rash noted. No erythema. No pallor.  Psychiatric: She has a normal mood and affect. Her speech is normal and behavior is normal. Her mood appears not anxious.  Nursing note and vitals reviewed.   ED Course  Procedures (including critical care time)  Medications  dexamethasone (DECADRON) injection 10 mg (not administered)  diazepam (VALIUM) injection 5 mg (not administered)    Review of prior ED visit shows over the past 2 years patient has had several ED visits for chronic back pain.   Patient's husband is here. He can drive her home. She was given Decadron and Valium IM.    MDM  patient presents with acute exacerbation of her chronic low back pain. She has pain radiating down both legs. She has no symptoms of incontinence to suggest cauda equina syndrome. She will be referred to neurosurgery for further evaluation.    Final diagnoses:  Acute exacerbation of chronic low back pain    New Prescriptions   CYCLOBENZAPRINE (FLEXERIL) 10 MG TABLET    Take 1 tablet (10 mg total) by mouth 3 (three) times daily as needed (muscle soreness).   NAPROXEN (NAPROSYN) 500 MG TABLET    Take 1 po BID with food prn pain   PREDNISONE (DELTASONE) 20 MG TABLET    Take 3 po QD x 3d , then 2 po QD x 3d then 1 po QD x 3d    Plan discharge  Devoria Albe, MD, Concha Pyo, MD 01/12/15 [phone]

## 2015-02-18 ENCOUNTER — Encounter (HOSPITAL_COMMUNITY): Payer: Self-pay | Admitting: *Deleted

## 2015-02-18 ENCOUNTER — Emergency Department (HOSPITAL_COMMUNITY)
Admission: EM | Admit: 2015-02-18 | Discharge: 2015-02-18 | Disposition: A | Discharge status: Home / Self Care | Payer: Self-pay | Attending: Emergency Medicine | Admitting: Emergency Medicine

## 2015-02-18 DIAGNOSIS — Z880 Allergy status to penicillin: Secondary | ICD-10-CM | POA: Insufficient documentation

## 2015-02-18 DIAGNOSIS — N938 Other specified abnormal uterine and vaginal bleeding: Secondary | ICD-10-CM | POA: Insufficient documentation

## 2015-02-18 DIAGNOSIS — Z8719 Personal history of other diseases of the digestive system: Secondary | ICD-10-CM | POA: Insufficient documentation

## 2015-02-18 DIAGNOSIS — Z8669 Personal history of other diseases of the nervous system and sense organs: Secondary | ICD-10-CM | POA: Insufficient documentation

## 2015-02-18 DIAGNOSIS — R102 Pelvic and perineal pain: Secondary | ICD-10-CM | POA: Insufficient documentation

## 2015-02-18 DIAGNOSIS — Z8739 Personal history of other diseases of the musculoskeletal system and connective tissue: Secondary | ICD-10-CM | POA: Insufficient documentation

## 2015-02-18 DIAGNOSIS — F17210 Nicotine dependence, cigarettes, uncomplicated: Secondary | ICD-10-CM | POA: Insufficient documentation

## 2015-02-18 DIAGNOSIS — Z91040 Latex allergy status: Secondary | ICD-10-CM | POA: Insufficient documentation

## 2015-02-18 MED ORDER — ACETAMINOPHEN-CODEINE #3 300-30 MG PO TABS: 1.0000 | ORAL_TABLET | ORAL | Status: DC | PRN

## 2015-02-18 MED ORDER — DOXYCYCLINE HYCLATE 100 MG PO CAPS
100.0000 mg | ORAL_CAPSULE | Freq: Two times a day (BID) | ORAL | Status: DC
Start: 2015-02-18 — End: 2016-02-17

## 2015-02-18 NOTE — ED Provider Notes (Signed)
CSN: [id number]     Arrival date & time 02/18/15  1522 History  By signing my name below, I, Lisa Mcclain, attest that this documentation has been prepared under the direction and in the presence of Lisa QualeHobson Kortez Murtagh, PA-C Electronically Signed: Jarvis Morganaylor Mcclain, ED Scribe. 02/18/2015. 4:49 PM.    Chief Complaint  Patient presents with  . Foreign Body in Vagina    Patient is a 44 y.o. female presenting with foreign body in vagina. The history is provided by the patient.  Foreign Body in Vagina This is a new problem. The current episode started more than 1 week ago. The problem occurs rarely. The problem has not changed since onset.Nothing aggravates the symptoms. Nothing relieves the symptoms. She has tried nothing for the symptoms.    HPI Comments: Lisa Mcclain is a 44 y.o. female with no PMHx who presents to the Emergency Department complaining of a possible foreign body in her vagina. She states she has had a tampon stuck in her vagina for approximately 2 weeks. She reports she thought she had gotten it out 2 weeks ago because she pulled the string and thought she removed it. Pt admits to associated cramping pelvic pain and mild vaginal bleeding today after trying to remove the tampon today. She states she has not been sexually active in over 2 years. Pt denies any h/o vaginal or abdominal surgeries. Pt denies any medical history that would cause her to be immunocompromised. Pt denies any abnormal vaginal discharge, vaginal odor, fever, nausea or vomiting.   Past Medical History  Diagnosis Date  . Celiac disease   . Back pain   . Tachycardia   . Nerve compression   . Arthritis   . Degeneration of cervical intervertebral disc   . Sciatica    Past Surgical History  Procedure Laterality Date  . I&d extremity  04/20/2012    Procedure: IRRIGATION AND DEBRIDEMENT EXTREMITY;  Surgeon: Marlowe ShoresMatthew A Weingold, MD;  Location: [facility];  Service: Orthopedics;  Laterality: Left;    Family History  Problem Relation Age of Onset  . Cancer Other    Social History  Substance Use Topics  . Smoking status: Current Every Day Smoker -- 1.00 packs/day for 28 years    Types: Cigarettes  . Smokeless tobacco: Never Used  . Alcohol Use: Yes     Comment: occasional use   OB History    Gravida Para Term Preterm AB TAB SAB Ectopic Multiple Living   3 2   1  1   2      Review of Systems  Genitourinary: Positive for vaginal bleeding and pelvic pain.  All other systems reviewed and are negative.     Allergies  Cephalexin; Erythromycin; Gluten; Tramadol; Wheat; Latex; and Penicillins  Home Medications   Prior to Admission medications   Medication Sig Start Date End Date Taking? Authorizing Provider  acetaminophen (TYLENOL) 500 MG tablet Take 1,000 mg by mouth every 8 (eight) hours as needed for moderate pain.   Yes Historical Provider, MD   Triage Vitals: BP 112/83 mmHg  Pulse 95  Temp(Src) 98.1 F (36.7 C) (Oral)  Resp 18  Ht 5\' 4"  (1.626 m)  Wt 145 lb (65.772 kg)  BMI 24.88 kg/m2  SpO2 99%  Physical Exam  Constitutional: She is oriented to person, place, and time. She appears well-developed and well-nourished. No distress.  HENT:  Head: Normocephalic and atraumatic.  Eyes: Conjunctivae and EOM are normal.  Neck: Neck supple. No tracheal deviation  present.  Cardiovascular: Normal rate, regular rhythm and normal heart sounds.   Pulmonary/Chest: Effort normal and breath sounds normal. No respiratory distress.  Abdominal: Soft. She exhibits no mass. There is no CVA tenderness.  Genitourinary: There is no lesion on the right labia. There is no lesion on the left labia. Right adnexum displays tenderness. Left adnexum displays tenderness. No foreign body around the vagina.  No lesions of external or internal labia There is a scratch on the cervix that has dried blood around it There is so old blood that appears to be menstrual blood in vaginal vault There is  tenderness in adnexal area right and left No foreign bodies seen  Musculoskeletal: Normal range of motion.  Neurological: She is alert and oriented to person, place, and time.  Skin: Skin is warm and dry.  Psychiatric: She has a normal mood and affect. Her behavior is normal.  Nursing note and vitals reviewed. Examination chaperoned by Drinda Butts, RN   ED Course  Procedures (including critical care time)  DIAGNOSTIC STUDIES: Oxygen Saturation is 99% on RA, normal by my interpretation.    COORDINATION OF CARE:  5:47 PM- Will give rx for doxycyline and Tylenol codeine. She is to see GYN physician if not improving. Pt advised of plan for treatment and pt agrees.  Labs Review Labs Reviewed - No data to display  Imaging Review No results found. I have personally reviewed and evaluated these images and lab results as part of my medical decision-making.   EKG Interpretation None      MDM  Vital signs are well within normal limits. The patient has a scratch on the os of the cervix were dried blood present. There is what appears to be menstrual blood in the vaginal vault. The patient has adnexal tenderness. The patient will be treated with doxycycline since there is a possibility that she had a tampon in place for an extended period of time. The patient is to see the GYN specialist if this discomfort is not resolved in the next 4 or 5 days. The patient is in agreement with this discharge plan.    Final diagnoses:  None    *I have reviewed nursing notes, vital signs, Probably most 30 year oldand all appropriate lab and imaging results for this patient.** **I personally performed the services described in this documentation, which was scribed in my presence. The recorded information has been reviewed and is accurate.Lisa Quale, PA-C 02/18/15 1757  Donnetta Hutching, MD 02/18/15 [phone]

## 2015-02-18 NOTE — Discharge Instructions (Signed)
No foreign body noted on your examination at this time. Please use doxycycline 2 times daily with food. Use Tylenol or ibuprofen for mild discomfort. Used Tylenol codeine for more severe discomfort. This medication may cause drowsiness, please use with caution. Please see Dr. Emelda FearFerguson, or the GYN specialist of your choice if not improving in the next 4 or 5 days.

## 2015-02-18 NOTE — ED Notes (Signed)
Patient reports having tampon stuck x 2 weeks. States she thought it came out 2 weeks ago but states she felt it today.

## 2016-02-17 ENCOUNTER — Emergency Department (HOSPITAL_COMMUNITY): Payer: Self-pay

## 2016-02-17 ENCOUNTER — Emergency Department (HOSPITAL_COMMUNITY)
Admission: EM | Admit: 2016-02-17 | Discharge: 2016-02-17 | Disposition: A | Discharge status: Home / Self Care | Payer: Self-pay | Attending: Emergency Medicine | Admitting: Emergency Medicine

## 2016-02-17 ENCOUNTER — Encounter (HOSPITAL_COMMUNITY): Payer: Self-pay | Admitting: Emergency Medicine

## 2016-02-17 DIAGNOSIS — M7592 Shoulder lesion, unspecified, left shoulder: Secondary | ICD-10-CM | POA: Insufficient documentation

## 2016-02-17 DIAGNOSIS — F17210 Nicotine dependence, cigarettes, uncomplicated: Secondary | ICD-10-CM | POA: Insufficient documentation

## 2016-02-17 DIAGNOSIS — M778 Other enthesopathies, not elsewhere classified: Secondary | ICD-10-CM

## 2016-02-17 DIAGNOSIS — M7582 Other shoulder lesions, left shoulder: Secondary | ICD-10-CM

## 2016-02-17 MED ORDER — MELOXICAM 7.5 MG PO TABS: 7.5000 mg | ORAL_TABLET | Freq: Every day | ORAL | 0 refills | Status: AC

## 2016-02-17 NOTE — ED Triage Notes (Signed)
Pt reports L shoulder pain that started on Friday. No known injury. Pt states the only thing she did was pick up a bag of dog food.

## 2016-02-17 NOTE — ED Provider Notes (Signed)
[facility] DEPT Provider Note   CSN: [id number] Arrival date & time: 02/17/16  1044  By signing my name below, I, Emmanuella Mensah, attest that this documentation has been prepared under the direction and in the presence of Langston MaskerKaren Torrez Renfroe, PA-C. Electronically Signed: Angelene GiovanniEmmanuella Mensah, ED Scribe. 02/17/16. 11:59 AM.   History   Chief Complaint Chief Complaint  Patient presents with  . Shoulder Pain    HPI Comments: Lisa Mcclain is a 45 y.o. female with a hx of sciatica and degeneration of cervical disc who presents to the Emergency Department complaining of gradually worsening moderate left anterior and posterior shoulder pain onset 3 days ago. She reports associated limited ROM of that shoulder. She explains that the only thing she can remember is picking up a 35 lb bag of dog food but known injuries or trauma. No alleviating factors noted. Pt has not tried any medications PTA. She has an allergy to Cephalexin, Erythromycin, Tramadol, and Penicillins. She reports a hx of a " partial tear" in her left shoulder but denies any surgeries. She also denies any fever, chills, vomiting, joint swelling, or any other symptoms.   The history is provided by the patient. No language interpreter was used.    Past Medical History:  Diagnosis Date  . Arthritis   . Back pain   . Celiac disease   . Degeneration of cervical intervertebral disc   . Nerve compression   . Sciatica   . Tachycardia     There are no active problems to display for this patient.   Past Surgical History:  Procedure Laterality Date  . I&D EXTREMITY  04/20/2012   Procedure: IRRIGATION AND DEBRIDEMENT EXTREMITY;  Surgeon: Marlowe ShoresMatthew A Weingold, MD;  Location: [facility];  Service: Orthopedics;  Laterality: Left;    OB History    Gravida Para Term Preterm AB Living   3 2     1 2    SAB TAB Ectopic Multiple Live Births   1               Home Medications    Prior to Admission medications   Not on  File    Family History Family History  Problem Relation Age of Onset  . Cancer Other     Social History Social History  Substance Use Topics  . Smoking status: Current Every Day Smoker    Packs/day: 0.50    Years: 28.00    Types: Cigarettes  . Smokeless tobacco: Never Used  . Alcohol use Yes     Comment: occasional use     Allergies   Cephalexin; Erythromycin; Gluten; Tramadol; Wheat; Latex; and Penicillins   Review of Systems Review of Systems  Constitutional: Negative for chills and fever.  Gastrointestinal: Negative for vomiting.  Musculoskeletal: Positive for arthralgias. Negative for joint swelling.  All other systems reviewed and are negative.    Physical Exam Updated Vital Signs BP 155/100 (BP Location: Right Arm)   Pulse 101   Temp 97.5 F (36.4 C) (Oral)   Resp 20   Ht 5\' 4"  (1.626 m)   Wt 140 lb (63.5 kg)   LMP 12/25/2015   SpO2 97%   BMI 24.03 kg/m   Physical Exam  Constitutional: She is oriented to person, place, and time. She appears well-developed and well-nourished. No distress.  HENT:  Head: Normocephalic and atraumatic.  Eyes: Conjunctivae and EOM are normal.  Neck: Neck supple. No tracheal deviation present.  Cardiovascular: Normal rate.   Pulmonary/Chest: Effort  normal. No respiratory distress.  Musculoskeletal: She exhibits tenderness.  Decreased ROM left shoulder; pain with palpation; neurovascularly intact Pt holds shoulder like a frozen shoulder  Neurological: She is alert and oriented to person, place, and time.  Skin: Skin is warm and dry.  Psychiatric: She has a normal mood and affect. Her behavior is normal.  Nursing note and vitals reviewed.    ED Treatments / Results  DIAGNOSTIC STUDIES: Oxygen Saturation is 97% on RA, normal by my interpretation.    COORDINATION OF CARE: 11:45 AM- Pt advised of plan for treatment and pt agrees. She will receive left shoulder x-ray for further evaluation.    Labs (all labs ordered  are listed, but only abnormal results are displayed) Labs Reviewed - No data to display  EKG  EKG Interpretation None       Radiology No results found.  Procedures Procedures (including critical care time)  Medications Ordered in ED Medications - No data to display   Initial Impression / Assessment and Plan / ED Course  Langston MaskerKaren Mustafa Potts, PA-C has reviewed the triage vital signs and the nursing notes.  Pertinent labs & imaging results that were available during my care of the patient were reviewed by me and considered in my medical decision making (see chart for details).  Clinical Course      Patient X-Ray negative for obvious fracture or dislocation. Pain managed in ED. Pt advised to follow up with orthopedics if symptoms persist for possibility of missed fracture diagnosis. Patient given brace while in ED, conservative therapy recommended and discussed. Patient will be dc home & is agreeable with above plan.   Final Clinical Impressions(s) / ED Diagnoses   Final diagnoses:  Tendonitis of shoulder, left    New Prescriptions Discharge Medication List as of 02/17/2016  2:02 PM    START taking these medications   Details  meloxicam (MOBIC) 7.5 MG tablet Take 1 tablet (7.5 mg total) by mouth daily., Starting Mon 02/17/2016, Print      An After Visit Summary was printed and given to the patient.  I personally performed the services in this documentation, which was scribed in my presence.  The recorded information has been reviewed and considered.   Barnet PallKaren SofiaPAC.   Lonia SkinnerLeslie K Charlotte HallSofia, PA-C 02/18/16 [id number]    Geoffery Lyonsouglas Delo, MD 02/18/16 [phone]

## 2017-08-13 IMAGING — DX DG SHOULDER 2+V*L*
3 series · 3 of 3 positions shown · non-contrast
Comparison: Left shoulder series dated June 19, 2011

CLINICAL DATA: Left shoulder pain which began following picking up
a bag of CT food on [REDACTED] . The pain has worsened since [REDACTED].

EXAM:
LEFT SHOULDER - 2+ VIEW

[shoulder grashey]
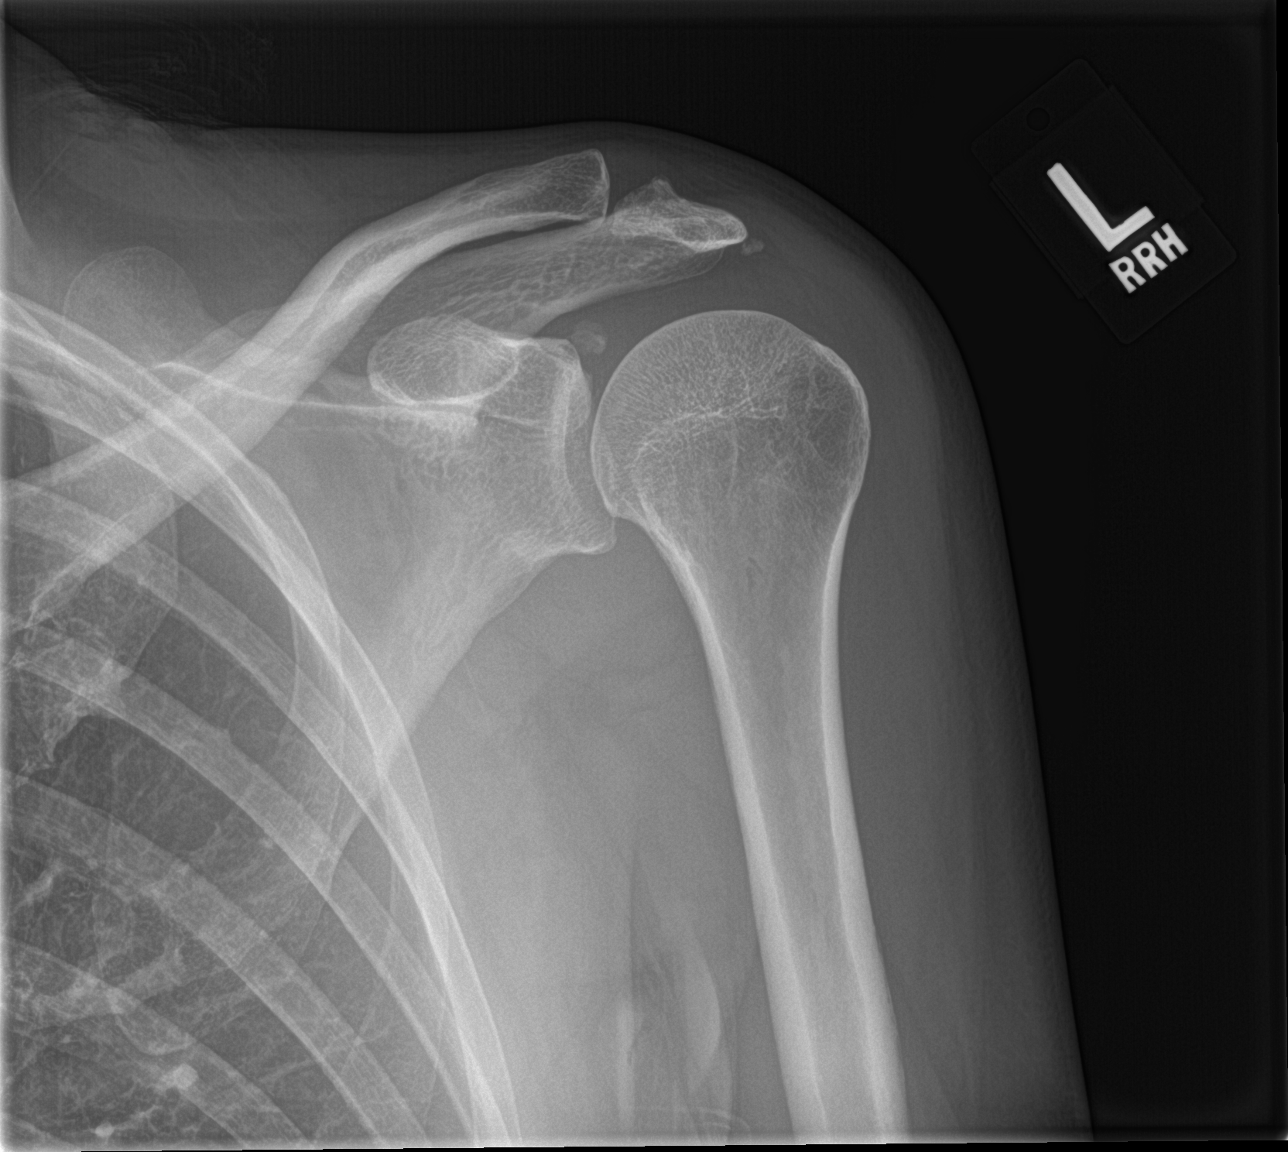

[shoulder y view]
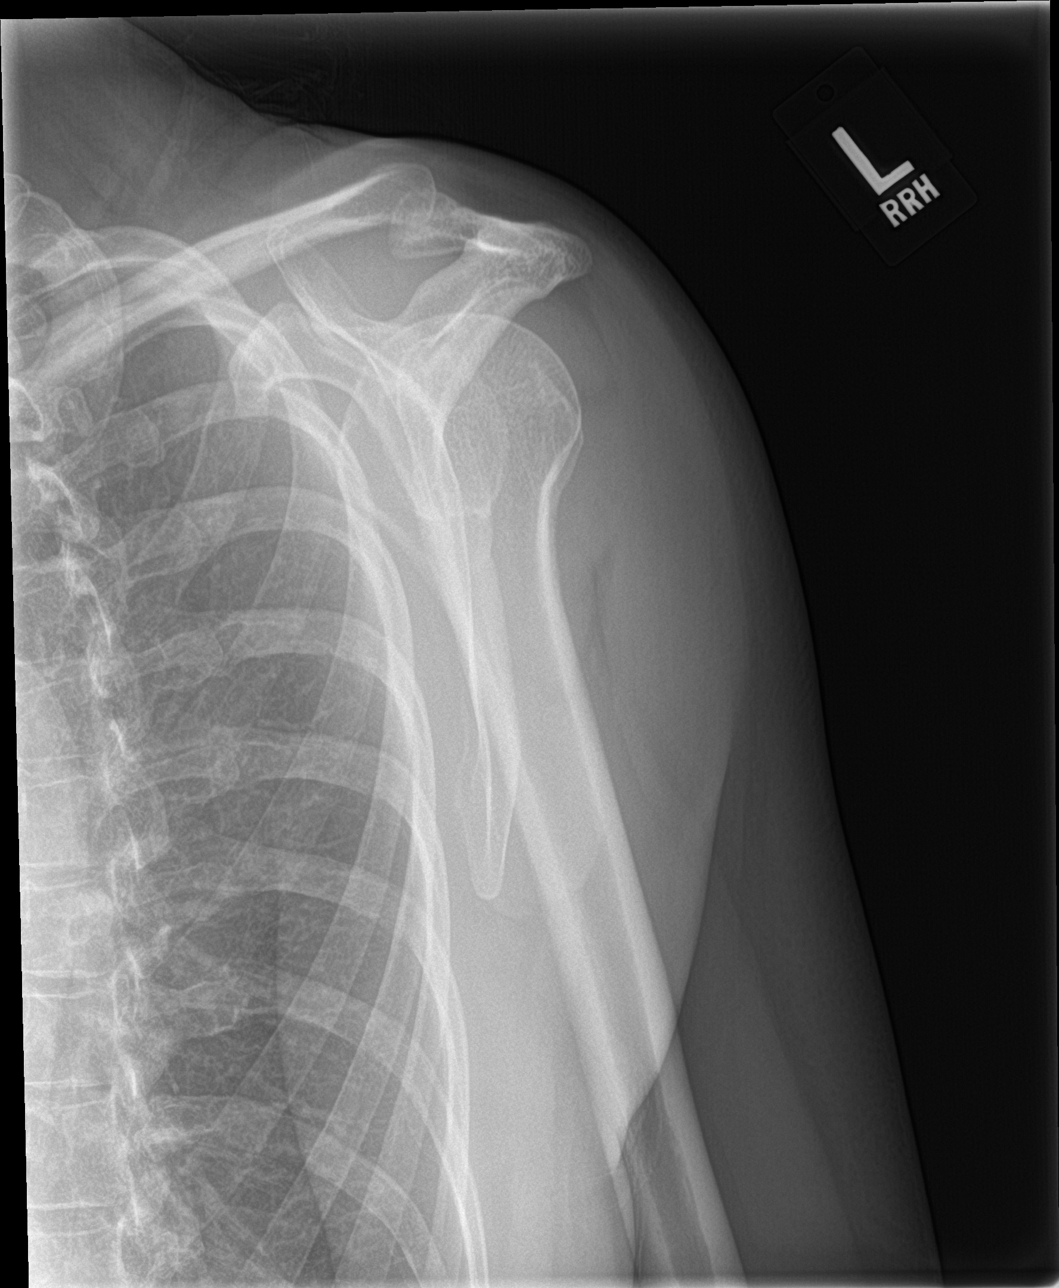

[shoulder axillary]
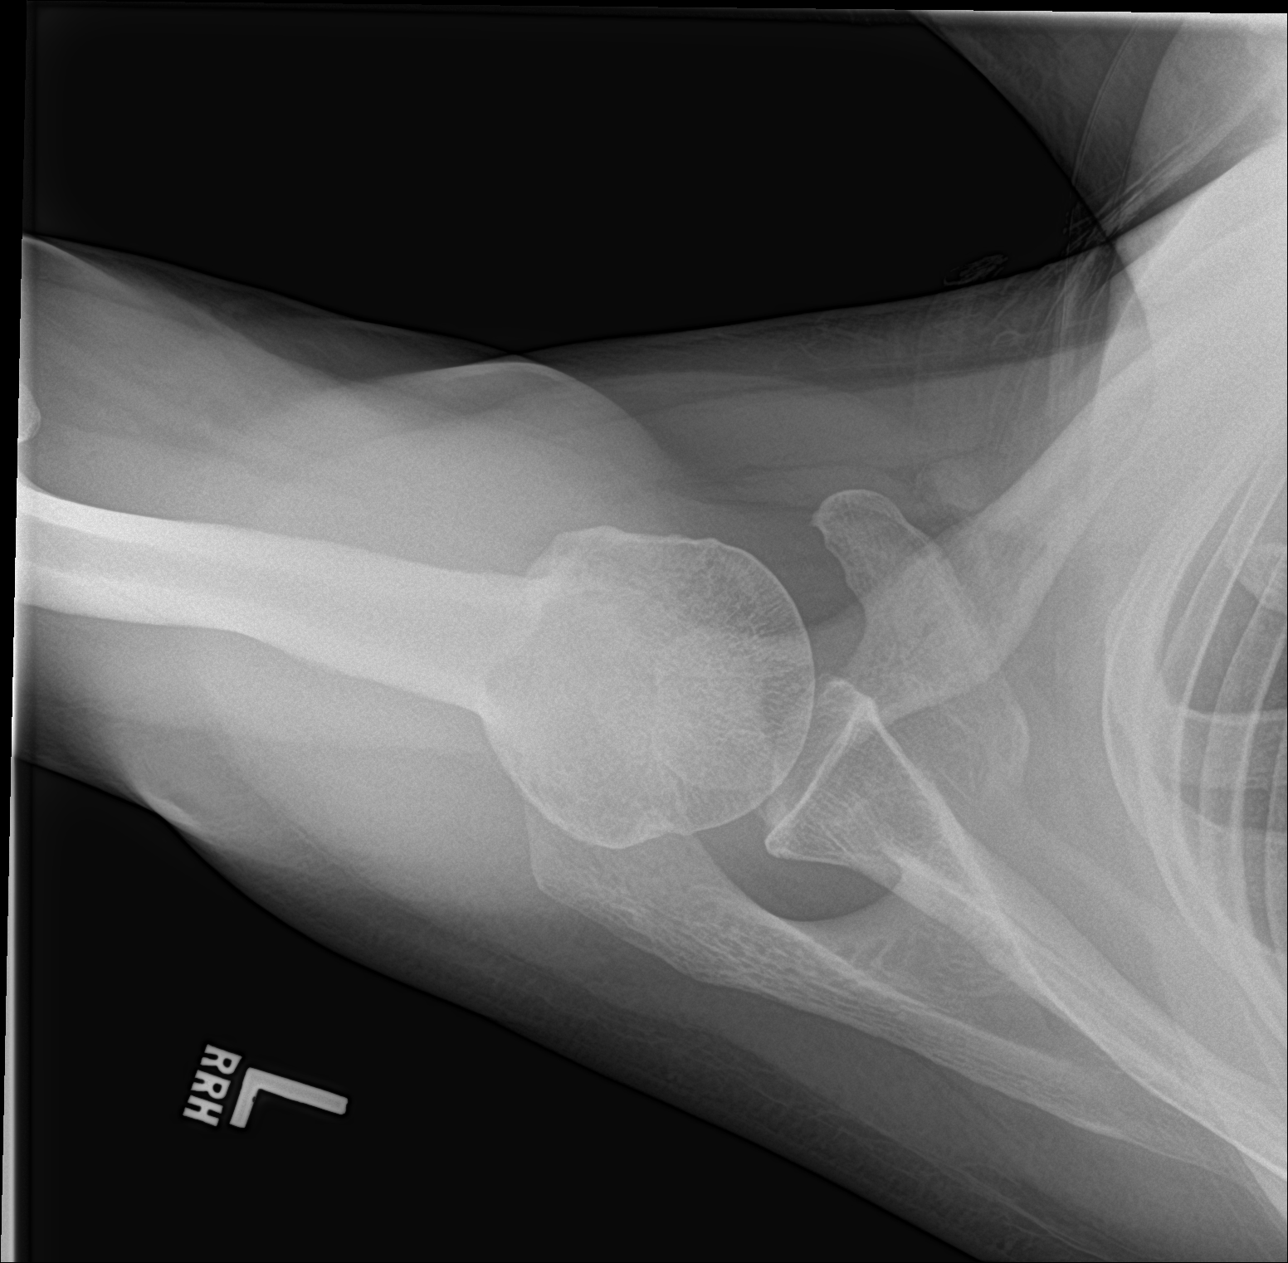

[3 of 3 positions shown; findings below may reference images not displayed]

FINDINGS: The bones are subjectively adequately mineralized. There is no acute
fracture or dislocation. The AC and glenohumeral joint spaces are
preserved. There are new soft tissue calcifications present. One
lies along the superior glenoid margin in the second lies inferior
and lateral to the lateral margin of the acromion.
IMPRESSION: No acute bony abnormality. There are soft tissue calcifications
which may reflect calcific tendinitis or bursitis. If the patient's
symptoms warrant further evaluation, MRI would be a useful next
imaging step.

## 2019-06-18 ENCOUNTER — Emergency Department (HOSPITAL_COMMUNITY)
Admission: EM | Admit: 2019-06-18 | Discharge: 2019-06-18 | Disposition: A | Discharge status: Home / Self Care | Payer: Self-pay | Attending: Emergency Medicine | Admitting: Emergency Medicine

## 2019-06-18 ENCOUNTER — Other Ambulatory Visit: Payer: Self-pay

## 2019-06-18 ENCOUNTER — Encounter (HOSPITAL_COMMUNITY): Payer: Self-pay | Admitting: Emergency Medicine

## 2019-06-18 DIAGNOSIS — M5481 Occipital neuralgia: Secondary | ICD-10-CM | POA: Insufficient documentation

## 2019-06-18 DIAGNOSIS — F17210 Nicotine dependence, cigarettes, uncomplicated: Secondary | ICD-10-CM | POA: Insufficient documentation

## 2019-06-18 MED ORDER — GABAPENTIN 300 MG PO CAPS
300.0000 mg | ORAL_CAPSULE | Freq: Once | ORAL | Status: AC
  Administered 2019-06-18: 300 mg via ORAL
  Filled 2019-06-18: qty 1

## 2019-06-18 MED ORDER — GABAPENTIN 300 MG PO CAPS: 300.0000 mg | ORAL_CAPSULE | Freq: Three times a day (TID) | ORAL | 0 refills | Status: AC

## 2019-06-18 NOTE — ED Provider Notes (Signed)
**Note De-Identified Lisa Obfuscation** [facility] Emergency Department Provider Note MRN:  [id number]  Arrival date & time: 06/18/19     Chief Complaint   Neck Pain   History of Present Illness   Lisa Mcclain is a 49 y.o. year-old female with a history of arthritis presenting to the ED with chief complaint of neck pain.  Right posterior neck pain for the past 1 or 2 weeks, worse with moving her neck in certain positions.  Denies IV drug use, no fever, no trauma, no chest pain, shortness of breath, no abdominal pain, no numbness or weakness to the arms or legs.  Symptoms intermittent, moderate to severe.  Review of Systems  A complete 10 system review of systems was obtained and all systems are negative except as noted in the HPI and PMH.   Patient's Health History    Past Medical History:  Diagnosis Date  . Arthritis   . Back pain   . Celiac disease   . Degeneration of cervical intervertebral disc   . Nerve compression   . Sciatica   . Tachycardia     Past Surgical History:  Procedure Laterality Date  . I & D EXTREMITY  04/20/2012   Procedure: IRRIGATION AND DEBRIDEMENT EXTREMITY;  Surgeon: Schuyler Amor, MD;  Location: [place];  Service: Orthopedics;  Laterality: Left;    Family History  Problem Relation Age of Onset  . Cancer Other     Social History   Socioeconomic History  . Marital status: Legally Separated    Spouse name: Not on file  . Number of children: Not on file  . Years of education: Not on file  . Highest education level: Not on file  Occupational History  . Not on file  Tobacco Use  . Smoking status: Current Every Day Smoker    Packs/day: 0.50    Years: 28.00    Pack years: 14.00    Types: Cigarettes  . Smokeless tobacco: Never Used  Substance and Sexual Activity  . Alcohol use: Yes    Comment: occasional use  . Drug use: Yes    Types: Marijuana    Comment: occasional use   . Sexual activity: Yes    Birth control/protection:  None  Other Topics Concern  . Not on file  Social History Narrative  . Not on file   Social Determinants of Health   Financial Resource Strain:   . Difficulty of Paying Living Expenses:   Food Insecurity:   . Worried About Charity fundraiser in the Last Year:   . Arboriculturist in the Last Year:   Transportation Needs:   . Film/video editor (Medical):   Marland Kitchen Lack of Transportation (Non-Medical):   Physical Activity:   . Days of Exercise per Week:   . Minutes of Exercise per Session:   Stress:   . Feeling of Stress :   Social Connections:   . Frequency of Communication with Friends and Family:   . Frequency of Social Gatherings with Friends and Family:   . Attends Religious Services:   . Active Member of Clubs or Organizations:   . Attends Archivist Meetings:   Marland Kitchen Marital Status:   Intimate Partner Violence:   . Fear of Current or Ex-Partner:   . Emotionally Abused:   Marland Kitchen Physically Abused:   . Sexually Abused:      Physical Exam   Vitals:   06/18/19 2049 06/18/19 2200  BP: (!) 138/97 Marland Kitchen)  120/94  Pulse: (!) 123 94  Resp: 16   Temp: 98 F (36.7 C)   SpO2: 100% 97%    CONSTITUTIONAL: Chronically ill-appearing, NAD NEURO:  Alert and oriented x 3, no focal deficits EYES:  eyes equal and reactive ENT/NECK:  no LAD, no JVD CARDIO: Regular rate, well-perfused, normal S1 and S2 PULM:  CTAB no wheezing or rhonchi GI/GU:  normal bowel sounds, non-distended, non-tender MSK/SPINE:  No gross deformities, no edema, point tenderness to the right occiput SKIN:  no rash, atraumatic PSYCH:  Appropriate speech and behavior  *Additional and/or pertinent findings included in MDM below  Diagnostic and Interventional Summary    EKG Interpretation  Date/Time:    Ventricular Rate:    PR Interval:    QRS Duration:   QT Interval:    QTC Calculation:   R Axis:     Text Interpretation:        Labs Reviewed - No data to display  No orders to display      Medications  gabapentin (NEURONTIN) capsule 300 mg (300 mg Oral Given 06/18/19 2307)     Procedures  /  Critical Care Procedures  ED Course and Medical Decision Making  I have reviewed the triage vital signs, the nursing notes, and pertinent available records from the EMR.  Pertinent labs & imaging results that were available during my care of the patient were reviewed by me and considered in my medical decision making (see below for details).     Exam consistent with occipital neuralgia, no red flag symptoms to suggest myelopathy, denies numbness or weakness, no bowel or bladder dysfunction.  No fever, no IV drug use, initially tachycardic in triage but normal heart rate on my exam.  Appropriate for symptomatic management and discharge.    Elmer Sow. Pilar Plate, MD [facility] Emergency Medicine [facility] [email or web address]  Final Clinical Impressions(s) / ED Diagnoses     ICD-10-CM   1. Occipital neuralgia of right side  M54.81     ED Discharge Orders         Ordered    gabapentin (NEURONTIN) 300 MG capsule  3 times daily     06/18/19 2306           Discharge Instructions Discussed with and Provided to Patient:     Discharge Instructions     You were evaluated in the Emergency Department and after careful evaluation, we did not find any emergent condition requiring admission or further testing in the hospital.  Your exam/testing today is overall reassuring.  Please take the gabapentin as directed for the next week.  Please return to the Emergency Department if you experience any worsening of your condition.  We encourage you to follow up with a primary care provider.  Thank you for allowing [place] to be a part of your care.       Sabas Sous, MD 06/18/19 [phone]

## 2019-06-18 NOTE — ED Notes (Signed)
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

## 2019-06-18 NOTE — ED Triage Notes (Signed)
Neck pain with spasm that began today. Last took 1000 mg of ibuprofen at noon today.

## 2019-06-18 NOTE — Discharge Instructions (Addendum)
You were evaluated in the Emergency Department and after careful evaluation, we did not find any emergent condition requiring admission or further testing in the hospital.  Your exam/testing today is overall reassuring.  Please take the gabapentin as directed for the next week.  Please return to the Emergency Department if you experience any worsening of your condition.  We encourage you to follow up with a primary care provider.  Thank you for allowing [place] to be a part of your care.
